# Patient Record
Sex: Male | Born: 1955 | Race: White | Hispanic: No | Marital: Single | Smoking: Never smoker
Health system: Southern US, Community
[De-identification: ages and names within clinical notes are randomized; demographics above are authoritative.]

## PROBLEM LIST (undated history)

## (undated) DIAGNOSIS — G8929 Other chronic pain: Secondary | ICD-10-CM

## (undated) DIAGNOSIS — R42 Dizziness and giddiness: Secondary | ICD-10-CM

## (undated) DIAGNOSIS — F419 Anxiety disorder, unspecified: Secondary | ICD-10-CM

## (undated) DIAGNOSIS — R51 Headache: Secondary | ICD-10-CM

## (undated) DIAGNOSIS — F32A Depression, unspecified: Secondary | ICD-10-CM

## (undated) DIAGNOSIS — F329 Major depressive disorder, single episode, unspecified: Secondary | ICD-10-CM

## (undated) DIAGNOSIS — K219 Gastro-esophageal reflux disease without esophagitis: Secondary | ICD-10-CM

## (undated) DIAGNOSIS — J45909 Unspecified asthma, uncomplicated: Secondary | ICD-10-CM

## (undated) DIAGNOSIS — N200 Calculus of kidney: Secondary | ICD-10-CM

## (undated) DIAGNOSIS — R519 Headache, unspecified: Secondary | ICD-10-CM

## (undated) DIAGNOSIS — L409 Psoriasis, unspecified: Secondary | ICD-10-CM

## (undated) DIAGNOSIS — R109 Unspecified abdominal pain: Secondary | ICD-10-CM

## (undated) DIAGNOSIS — M79673 Pain in unspecified foot: Secondary | ICD-10-CM

## (undated) HISTORY — PX: TONSILLECTOMY: SUR1361

## (undated) HISTORY — PX: COLONOSCOPY: SHX174

## (undated) HISTORY — PX: APPENDECTOMY: SHX54

## (undated) HISTORY — PX: OTHER SURGICAL HISTORY: SHX169

## (undated) HISTORY — PX: WISDOM TOOTH EXTRACTION: SHX21

---

## 1999-09-16 ENCOUNTER — Encounter: Payer: Self-pay | Admitting: Emergency Medicine

## 1999-09-16 ENCOUNTER — Emergency Department (HOSPITAL_COMMUNITY): Admission: EM | Admit: 1999-09-16 | Discharge: 1999-09-16 | Payer: Self-pay | Admitting: Emergency Medicine

## 1999-10-07 ENCOUNTER — Encounter: Payer: Self-pay | Admitting: Gastroenterology

## 1999-10-07 ENCOUNTER — Encounter: Admission: RE | Admit: 1999-10-07 | Discharge: 1999-10-07 | Payer: Self-pay | Admitting: Gastroenterology

## 1999-10-29 ENCOUNTER — Encounter: Payer: Self-pay | Admitting: Gastroenterology

## 1999-10-29 ENCOUNTER — Ambulatory Visit (HOSPITAL_COMMUNITY): Admission: RE | Admit: 1999-10-29 | Discharge: 1999-10-29 | Payer: Self-pay | Admitting: Gastroenterology

## 1999-11-04 ENCOUNTER — Encounter: Admission: RE | Admit: 1999-11-04 | Discharge: 1999-11-04 | Payer: Self-pay | Admitting: Gastroenterology

## 1999-11-04 ENCOUNTER — Encounter: Payer: Self-pay | Admitting: Gastroenterology

## 2004-11-05 ENCOUNTER — Encounter: Admission: RE | Admit: 2004-11-05 | Discharge: 2004-11-05 | Payer: Self-pay | Admitting: Family Medicine

## 2008-07-27 ENCOUNTER — Encounter: Admission: RE | Admit: 2008-07-27 | Discharge: 2008-07-27 | Payer: Self-pay | Admitting: Gastroenterology

## 2008-07-27 ENCOUNTER — Emergency Department: Payer: Self-pay | Admitting: Emergency Medicine

## 2008-11-23 ENCOUNTER — Encounter: Admission: RE | Admit: 2008-11-23 | Discharge: 2008-11-23 | Payer: Self-pay | Admitting: Family Medicine

## 2009-04-02 ENCOUNTER — Inpatient Hospital Stay: Payer: Self-pay | Admitting: Unknown Physician Specialty

## 2009-05-09 ENCOUNTER — Encounter: Admission: RE | Admit: 2009-05-09 | Discharge: 2009-05-09 | Payer: Self-pay | Admitting: Gastroenterology

## 2011-02-03 ENCOUNTER — Other Ambulatory Visit: Payer: Self-pay | Admitting: Otolaryngology

## 2011-02-23 ENCOUNTER — Other Ambulatory Visit: Payer: Self-pay

## 2011-08-31 ENCOUNTER — Ambulatory Visit
Admission: RE | Admit: 2011-08-31 | Discharge: 2011-08-31 | Disposition: A | Discharge status: Home / Self Care | Payer: BC Managed Care – PPO | Source: Ambulatory Visit | Attending: Otolaryngology | Admitting: Otolaryngology

## 2011-08-31 MED ORDER — GADOBENATE DIMEGLUMINE 529 MG/ML IV SOLN
18.0000 mL | Freq: Once | INTRAVENOUS | Status: AC | PRN
  Administered 2011-08-31: 18 mL via INTRAVENOUS

## 2012-03-18 ENCOUNTER — Emergency Department (HOSPITAL_COMMUNITY)
Admission: EM | Admit: 2012-03-18 | Discharge: 2012-03-18 | Disposition: A | Discharge status: Home / Self Care | Payer: BC Managed Care – PPO | Attending: Emergency Medicine | Admitting: Emergency Medicine

## 2012-03-18 ENCOUNTER — Encounter (HOSPITAL_COMMUNITY): Payer: Self-pay | Admitting: Emergency Medicine

## 2012-03-18 DIAGNOSIS — F3289 Other specified depressive episodes: Secondary | ICD-10-CM | POA: Insufficient documentation

## 2012-03-18 DIAGNOSIS — Z79899 Other long term (current) drug therapy: Secondary | ICD-10-CM | POA: Insufficient documentation

## 2012-03-18 DIAGNOSIS — R42 Dizziness and giddiness: Secondary | ICD-10-CM

## 2012-03-18 DIAGNOSIS — F329 Major depressive disorder, single episode, unspecified: Secondary | ICD-10-CM | POA: Insufficient documentation

## 2012-03-18 DIAGNOSIS — H9319 Tinnitus, unspecified ear: Secondary | ICD-10-CM | POA: Insufficient documentation

## 2012-03-18 DIAGNOSIS — R110 Nausea: Secondary | ICD-10-CM | POA: Insufficient documentation

## 2012-03-18 HISTORY — DX: Depression, unspecified: F32.A

## 2012-03-18 HISTORY — DX: Major depressive disorder, single episode, unspecified: F32.9

## 2012-03-18 LAB — BASIC METABOLIC PANEL
Chloride: 105 mEq/L (ref 96–112)
GFR calc Af Amer: 67 mL/min — ABNORMAL LOW (ref >90)
Potassium: 4.3 mEq/L (ref 3.5–5.1)
Sodium: 140 mEq/L (ref 135–145)

## 2012-03-18 LAB — CBC WITH DIFFERENTIAL/PLATELET
Basophils Absolute: 0 10*3/uL (ref 0.0–0.1)
Basophils Relative: 0 % (ref 0–1)
MCHC: 32.6 g/dL (ref 30.0–36.0)
Neutro Abs: 4 10*3/uL (ref 1.7–7.7)
Neutrophils Relative %: 58 % (ref 43–77)
RDW: 12.9 % (ref 11.5–15.5)
WBC: 7 10*3/uL (ref 4.0–10.5)

## 2012-03-18 LAB — GLUCOSE, CAPILLARY

## 2012-03-18 MED ORDER — MECLIZINE HCL 25 MG PO TABS
25.0000 mg | ORAL_TABLET | Freq: Once | ORAL | Status: AC
  Administered 2012-03-18: 25 mg via ORAL
  Filled 2012-03-18: qty 1

## 2012-03-18 MED ORDER — ONDANSETRON 4 MG PO TBDP
4.0000 mg | ORAL_TABLET | Freq: Once | ORAL | Status: AC
  Administered 2012-03-18: 4 mg via ORAL
  Filled 2012-03-18: qty 1

## 2012-03-18 MED ORDER — DIAZEPAM 5 MG PO TABS
5.0000 mg | ORAL_TABLET | Freq: Once | ORAL | Status: AC
  Administered 2012-03-18: 5 mg via ORAL
  Filled 2012-03-18: qty 1

## 2012-03-18 MED ORDER — DIAZEPAM 5 MG PO TABS: 5.0000 mg | ORAL_TABLET | Freq: Four times a day (QID) | ORAL | Status: DC | PRN

## 2012-03-18 MED ORDER — ONDANSETRON 4 MG PO TBDP: ORAL_TABLET | ORAL | Status: DC

## 2012-03-18 MED ORDER — MECLIZINE HCL 25 MG PO TABS: 25.0000 mg | ORAL_TABLET | Freq: Three times a day (TID) | ORAL | Status: DC

## 2012-03-18 NOTE — ED Notes (Signed)
Discharge instructions reviewed. Pt verbalized understanding.  

## 2012-03-18 NOTE — ED Notes (Signed)
Pt. Stated, I've had this dizziness since yesterday especially if i move in bed.  If i go from side to side I am so dizzy, it scares me.  i tried a Draminere yesterday and might have helped some.

## 2012-03-18 NOTE — ED Provider Notes (Signed)
History     CSN: [id number]  Arrival date & time 03/18/12  1334   First MD Initiated Contact with Patient 03/18/12 1458      Chief Complaint  Patient presents with  . Dizziness    (Consider location/radiation/quality/duration/timing/severity/associated sxs/prior treatment) HPI Comments: Went to urgent care yesterday for the same symptoms. He was told he could be hypoglycemic, however his blood sugar was not checked. He went to the pharmacy and bought a blood sugar monitor and stated his sugars were all between 800.  Patient is a 56 y.o. male presenting with neurologic complaint.  Neurologic Problem The primary symptoms include dizziness and nausea. Primary symptoms do not include fever or vomiting. The symptoms began yesterday. The symptoms are waxing and waning. The neurological symptoms are diffuse. Context: When he changes positions.  He describes the dizziness as a sensation of spinning. The dizziness began yesterday. The dizziness has been gradually improving since its onset. It is a new problem. The dizziness is associated with a specific position. Dizziness also occurs with tinnitus (Chronic) and nausea. Dizziness does not occur with blurred vision or vomiting.  Additional symptoms include tinnitus (Chronic). Medical issues do not include seizures, cerebral vascular accident or cancer. Workup history includes MRI (Negative).    Past Medical History  Diagnosis Date  . Depression     History reviewed. No pertinent past surgical history.  No family history on file.  History  Substance Use Topics  . Smoking status: Not on file  . Smokeless tobacco: Not on file  . Alcohol Use:       Review of Systems  Constitutional: Negative for fever and chills.  HENT: Positive for tinnitus (Chronic).   Eyes: Negative for blurred vision.  Respiratory: Negative for cough and shortness of breath.   Cardiovascular: Negative for chest pain and leg swelling.  Gastrointestinal: Positive  for nausea. Negative for vomiting, blood in stool and anal bleeding.  Neurological: Positive for dizziness.  All other systems reviewed and are negative.    Allergies  Ivp dye  Home Medications   Current Outpatient Rx  Name  Route  Sig  Dispense  Refill  . BUPROPION HCL ER (SR) 150 MG PO TB12   Oral   Take 150 mg by mouth daily.         . BUSPIRONE HCL 5 MG PO TABS   Oral   Take 5 mg by mouth daily.         Marland Kitchen OMEPRAZOLE-SODIUM BICARBONATE 40-1100 MG PO CAPS   Oral   Take 1 capsule by mouth daily before breakfast.         . TRAZODONE HCL 50 MG PO TABS   Oral   Take 25 mg by mouth at bedtime.           BP 127/80  Pulse 90  Temp 98.2 F (36.8 C) (Oral)  Resp 20  Ht 5\' 9"  (1.753 m)  Wt 195 lb (88.451 kg)  BMI 28.80 kg/m2  SpO2 96%  Physical Exam  Nursing note and vitals reviewed. Constitutional: He is oriented to person, place, and time. He appears well-developed and well-nourished. No distress.  HENT:  Head: Normocephalic and atraumatic.  Mouth/Throat: No oropharyngeal exudate.  Eyes: EOM are normal. Pupils are equal, round, and reactive to light.  Neck: Normal range of motion. Neck supple.  Cardiovascular: Normal rate and regular rhythm.  Exam reveals no friction rub.   No murmur heard. Pulmonary/Chest: Effort normal and breath sounds normal. No respiratory distress.  He has no wheezes. He has no rales.  Abdominal: He exhibits no distension. There is no tenderness. There is no rebound.  Musculoskeletal: Normal range of motion. He exhibits no edema.  Neurological: He is alert and oriented to person, place, and time. No cranial nerve deficit. He exhibits normal muscle tone. Coordination normal.       Head impulse testing normal. Patient has mild fatigable nystagmus with left to right and right to left eye movements. Dix-Hallpike grossly positive for return symptoms.  Skin: He is not diaphoretic.    ED Course  Procedures (including critical care  time)  Labs Reviewed  GLUCOSE, CAPILLARY - Abnormal; Notable for the following:    Glucose-Capillary 136 (*)     All other components within normal limits  BASIC METABOLIC PANEL - Abnormal; Notable for the following:    Glucose, Bld 117 (*)     GFR calc non Af Amer 57 (*)     GFR calc Af Amer 67 (*)     All other components within normal limits  CBC WITH DIFFERENTIAL   No results found.   1. Vertigo       MDM  56 year old male with no prior medical history other than depression presents with dizziness. Began yesterday morning and has been waxing and waning since. Was told he was hypoglycemic urgent care, however his home blood sugar monitor that he bought stated no hypoglycemia. His dizziness is described as a sensation of spinning it is worse with position changes like rolling over in bed. This is accompanied by some nausea. He has had an extensive workup by ENT and neurology for some momentary lapses of balance for the last several months. This workup included MRI of his brain, sinus evaluation, and a dental eval. Patient does not want any imaging today. On exam, patient has normal gait and other normal neurologic exam findings. Normal strength and sensation, normal coordination, normal cranial nerves. He does have a positive Dix-Hallpike and had some fatigable nystagmus with alternating eye movements. Patient symptoms are consistent with peripheral vertigo. I do not feel he has central vertigo. Patient given meclizine, Valium, Zofran here. He will go home with prescription for the same and instructions to followup with primary care.         Elwin Mocha, MD 03/18/12 1600

## 2012-03-19 NOTE — ED Provider Notes (Signed)
  I performed a history and physical examination of Scott Dominguez and discussed his management with Dr. Gwendolyn Grant.  I agree with the history, physical, assessment, and plan of care, with the following exceptions: None  On my exam he was symptomatic, having just had a Dix-Hallpike manouever.    Elyse Jarvis, MD 03/19/12 0005

## 2013-02-27 ENCOUNTER — Ambulatory Visit
Admission: RE | Admit: 2013-02-27 | Discharge: 2013-02-27 | Disposition: A | Discharge status: Home / Self Care | Payer: BC Managed Care – PPO | Source: Ambulatory Visit | Attending: Family Medicine | Admitting: Family Medicine

## 2013-02-27 ENCOUNTER — Other Ambulatory Visit: Payer: Self-pay | Admitting: Family Medicine

## 2013-02-27 DIAGNOSIS — M25571 Pain in right ankle and joints of right foot: Secondary | ICD-10-CM

## 2013-03-03 ENCOUNTER — Ambulatory Visit (INDEPENDENT_AMBULATORY_CARE_PROVIDER_SITE_OTHER): Payer: BC Managed Care – PPO

## 2013-03-03 VITALS — BP 137/85 | HR 83 | Resp 16 | Ht 71.0 in | Wt 195.0 lb

## 2013-03-03 DIAGNOSIS — R52 Pain, unspecified: Secondary | ICD-10-CM

## 2013-03-03 DIAGNOSIS — M7750 Other enthesopathy of unspecified foot: Secondary | ICD-10-CM

## 2013-03-03 DIAGNOSIS — R609 Edema, unspecified: Secondary | ICD-10-CM

## 2013-03-03 DIAGNOSIS — S93409A Sprain of unspecified ligament of unspecified ankle, initial encounter: Secondary | ICD-10-CM

## 2013-03-03 DIAGNOSIS — M7751 Other enthesopathy of right foot: Secondary | ICD-10-CM

## 2013-03-03 DIAGNOSIS — S93401A Sprain of unspecified ligament of right ankle, initial encounter: Secondary | ICD-10-CM

## 2013-03-03 NOTE — Progress Notes (Signed)
  Subjective:    Patient ID: Scott Dominguez, male    DOB: Dec 01, 1955, 57 y.o.   MRN: [id number] "I twisted this thing last Saturday.  It's still swollen and it's hurts."  Foot Injury  The incident occurred 5 to 7 days ago. The incident occurred in the yard. The injury mechanism was a twisting injury. The pain is present in the right ankle. The quality of the pain is described as aching. The pain is at a severity of 6/10. The pain is moderate. The pain has been intermittent since onset. Associated symptoms include an inability to bear weight and a loss of motion. He reports no foreign bodies present. The symptoms are aggravated by weight bearing and movement. He has tried elevation (ankle stabilizer, xrays, saw Dr. Irena Reichmann) for the symptoms. The treatment provided mild relief.      Review of Systems  HENT: Positive for sinus pressure and tinnitus.   Musculoskeletal: Positive for gait problem and joint swelling.  Psychiatric/Behavioral: The patient is nervous/anxious.   All other systems reviewed and are negative.       Objective:   Physical Exam Neurovascular status appears to be intact bilateral epicritic and proprioceptive sensations intact and symmetric bilateral. No severe ecchymosis no significant edema noted when compared to right to left foot. X-rays from previous visit of right ankle are reviewed. No signs of fracture or osseous abnormality were noted. However exam today although there was some pain in the retromalleolar area of lateral right ankle more significant pain was present on the lateral Lisfranc's fourth fifth metatarsal base and cuboid articulation and along the base of the fifth metatarsal. Also significant tenderness along the sinus tarsi area. At this time new x-rays of the foot were taken AP lateral oblique views. These also failed to demonstrate any fracture or significant dislocation cyst or tumor again based on these findings I do feel this is strictly a soft tissue  injury consistent with ankle sprain and midfoot/rear foot sprain of Lisfranc's joint and the lateral ankle joint sprain. Patient has been trying to ambulate with a OTC ankle splint with little success or improvement     Assessment & Plan:  Assessment this time no significant fracture identified cannot be ruled out with complete certainty although 95% likely no fracture. Most likely ankle sprain and possible sinus tarsi capsulitis a Lisfranc's capsulitis and sprain of the mid tarsus and Lisfranc joint. With lateral column injury. Plan at this time patient placed in the air fracture walker help with both the soft tissue swelling and edema and foot and ankle stability. Maintain for at least a 4 week duration as instructed may he is crocs or walk walking cane if needed followup if no significant improvement within 4 weeks. Maintain NSAID therapy or Advil and ice and as-needed basis  Alvan Dame DPM

## 2013-03-03 NOTE — Patient Instructions (Signed)

## 2014-05-24 ENCOUNTER — Other Ambulatory Visit: Payer: Self-pay | Admitting: Gastroenterology

## 2014-05-24 DIAGNOSIS — R1084 Generalized abdominal pain: Secondary | ICD-10-CM

## 2014-06-05 ENCOUNTER — Ambulatory Visit (INDEPENDENT_AMBULATORY_CARE_PROVIDER_SITE_OTHER): Payer: BLUE CROSS/BLUE SHIELD

## 2014-06-05 DIAGNOSIS — Q66229 Congenital metatarsus adductus, unspecified foot: Secondary | ICD-10-CM

## 2014-06-05 DIAGNOSIS — G5762 Lesion of plantar nerve, left lower limb: Secondary | ICD-10-CM

## 2014-06-05 DIAGNOSIS — R52 Pain, unspecified: Secondary | ICD-10-CM

## 2014-06-05 DIAGNOSIS — Q662 Congenital metatarsus (primus) varus: Secondary | ICD-10-CM

## 2014-06-05 DIAGNOSIS — M79673 Pain in unspecified foot: Secondary | ICD-10-CM

## 2014-06-05 DIAGNOSIS — M722 Plantar fascial fibromatosis: Secondary | ICD-10-CM

## 2014-06-05 DIAGNOSIS — R609 Edema, unspecified: Secondary | ICD-10-CM

## 2014-06-05 DIAGNOSIS — G5763 Lesion of plantar nerve, bilateral lower limbs: Secondary | ICD-10-CM

## 2014-06-05 MED ORDER — MELOXICAM 15 MG PO TABS: 15.0000 mg | ORAL_TABLET | Freq: Every day | ORAL | Status: DC

## 2014-06-05 NOTE — Progress Notes (Signed)
   Subjective:    Patient ID: Scott Dominguez, male    DOB: 1956-02-21, 59 y.o.   MRN: [id number]  HPI PT STATED B/L BALL OF THE FOOT AND ARCH IS ACHING AND SWOLLEN FOR 2 YEARS. THE FOOT IS GETTING WORSE AND GET AGGRAVATED BY SITTING DOWN . TRIED HOT BATH AND IT HELP SOME.   Review of Systems  All other systems reviewed and are negative.      Objective:   Physical Exam 59 year old white male well-developed well-nourished oriented 3 presents this time with a new problem she's had problems in the past valgus really exacerbated feels like there is tightness and swelling in the ball and arch of his foot when he pushes on the arch is pain up into the toes together second interspace of both feet left more slightly worse than right. No history of injury or trauma however currently is wearing a pair of sketcher shoes with memory foam they're extremely flimsy and flexible and mimic barefoot walking or running they have no structure supporter stability. Lower extremity objective findings reveal vascular status to be intact pedal pulses palpable DP +2 over 4 PT +2 over 4 bilateral capillary refill time 3 seconds all digits epicritic and proprioceptive sensations intact and symmetric there is normal plantar response DTRs not listed neurologically skin color pigment normal hair growth absent nails somewhat criptotic orthopedic exam rectus foot type slight met adductus forefoot mild bunion deformity and some semirigid digital contractures noted. No open wounds no ulcers no secondary infections no severe keratoses noted there is pain exquisite tenderness to mid arch mid and plantar fascia from mid band to the MTP joint area of both feet left more so than right. There is also pain on direct lateral compression second interspace bilateral left more so than right. 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 Plan at this time patient is given an injection tendons Kenalog, Xylocaine plain to the second interspace bilateral fascial strapping is applied to both feet recommended meloxicam 15 g once daily to the discontinue if any GI upset occurs recheck in 2 weeks for follow-up also recommended ice to the heel every evening and stressed better shoes Brooks new balance [place]'s or even crocs for around the house no barefoot or flimsy shoes or flip-flops are Claudette Stapler DPM

## 2014-06-05 NOTE — Patient Instructions (Signed)
Plantar Fasciitis Plantar fasciitis is a common condition that causes foot pain. It is soreness (inflammation) of the band of tough fibrous tissue on the bottom of the foot that runs from the heel bone (calcaneus) to the ball of the foot. The cause of this soreness may be from excessive standing, poor fitting shoes, running on hard surfaces, being overweight, having an abnormal walk, or overuse (this is common in runners) of the painful foot or feet. It is also common in aerobic exercise dancers and ballet dancers. SYMPTOMS  Most people with plantar fasciitis complain of:  Severe pain in the morning on the bottom of their foot especially when taking the first steps out of bed. This pain recedes after a few minutes of walking.  Severe pain is experienced also during walking following a long period of inactivity.  Pain is worse when walking barefoot or up stairs DIAGNOSIS   Your caregiver will diagnose this condition by examining and feeling your foot.  Special tests such as X-rays of your foot, are usually not needed. PREVENTION   Consult a sports medicine professional before beginning a new exercise program.  Walking programs offer a good workout. With walking there is a lower chance of overuse injuries common to runners. There is less impact and less jarring of the joints.  Begin all new exercise programs slowly. If problems or pain develop, decrease the amount of time or distance until you are at a comfortable level.  Wear good shoes and replace them regularly.  Stretch your foot and the heel cords at the back of the ankle (Achilles tendon) both before and after exercise.  Run or exercise on even surfaces that are not hard. For example, asphalt is better than pavement.  Do not run barefoot on hard surfaces.  If using a treadmill, vary the incline.  Do not continue to workout if you have foot or joint problems. Seek professional help if they do not improve. HOME CARE INSTRUCTIONS     Avoid activities that cause you pain until you recover.  Use ice or cold packs on the problem or painful areas after working out.  Only take over-the-counter or prescription medicines for pain, discomfort, or fever as directed by your caregiver.  Soft shoe inserts or athletic shoes with air or gel sole cushions may be helpful.  If problems continue or become more severe, consult a sports medicine caregiver or your own health care provider. Cortisone is a potent anti-inflammatory medication that may be injected into the painful area. You can discuss this treatment with your caregiver. MAKE SURE YOU:   Understand these instructions.  Will watch your condition.  Will get help right away if you are not doing well or get worse. Document Released: 12/30/2000 Document Revised: 06/29/2011 Document Reviewed: 02/29/2008 [employer] Patient Information 2015 [employer]. This information is not intended to replace advice given to you by your health care provider. Make sure you discuss any questions you have with your health care provider.    ICE INSTRUCTIONS  Apply ice or cold pack to the affected area at least 3 times a day for 10-15 minutes each time.  You should also use ice after prolonged activity or vigorous exercise.  Do not apply ice longer than 20 minutes at one time.  Always keep a cloth between your skin and the ice pack to prevent burns.  Being consistent and following these instructions will help control your symptoms.  We suggest you purchase a gel ice pack because they are   reusable and do bit leak.  Some of them are designed to wrap around the area.  Use the method that works best for you.  Here are some other suggestions for icing.   Use a frozen bag of peas or corn-inexpensive and molds well to your body, usually stays frozen for 10 to 20 minutes.  Wet a towel with cold water and squeeze out the excess until it's damp.  Place in a bag in the freezer for 20 minutes. Then remove  and use. 

## 2014-06-06 ENCOUNTER — Other Ambulatory Visit: Payer: Self-pay

## 2014-06-09 ENCOUNTER — Emergency Department (HOSPITAL_COMMUNITY): Payer: BLUE CROSS/BLUE SHIELD

## 2014-06-09 ENCOUNTER — Emergency Department (HOSPITAL_COMMUNITY)
Admission: EM | Admit: 2014-06-09 | Discharge: 2014-06-09 | Disposition: A | Discharge status: Home / Self Care | Payer: BLUE CROSS/BLUE SHIELD | Attending: Emergency Medicine | Admitting: Emergency Medicine

## 2014-06-09 ENCOUNTER — Encounter (HOSPITAL_COMMUNITY): Payer: Self-pay | Admitting: Emergency Medicine

## 2014-06-09 DIAGNOSIS — R1084 Generalized abdominal pain: Secondary | ICD-10-CM | POA: Diagnosis present

## 2014-06-09 DIAGNOSIS — Z79899 Other long term (current) drug therapy: Secondary | ICD-10-CM | POA: Insufficient documentation

## 2014-06-09 DIAGNOSIS — K219 Gastro-esophageal reflux disease without esophagitis: Secondary | ICD-10-CM | POA: Insufficient documentation

## 2014-06-09 DIAGNOSIS — Z791 Long term (current) use of non-steroidal anti-inflammatories (NSAID): Secondary | ICD-10-CM | POA: Insufficient documentation

## 2014-06-09 DIAGNOSIS — G8929 Other chronic pain: Secondary | ICD-10-CM | POA: Insufficient documentation

## 2014-06-09 DIAGNOSIS — R109 Unspecified abdominal pain: Secondary | ICD-10-CM

## 2014-06-09 DIAGNOSIS — Z8659 Personal history of other mental and behavioral disorders: Secondary | ICD-10-CM | POA: Diagnosis not present

## 2014-06-09 HISTORY — DX: Pain in unspecified foot: M79.673

## 2014-06-09 HISTORY — DX: Headache: R51

## 2014-06-09 HISTORY — DX: Gastro-esophageal reflux disease without esophagitis: K21.9

## 2014-06-09 HISTORY — DX: Unspecified abdominal pain: R10.9

## 2014-06-09 HISTORY — DX: Other chronic pain: G89.29

## 2014-06-09 HISTORY — DX: Headache, unspecified: R51.9

## 2014-06-09 HISTORY — DX: Dizziness and giddiness: R42

## 2014-06-09 LAB — CBC WITH DIFFERENTIAL/PLATELET
Basophils Absolute: 0 10*3/uL (ref 0.0–0.1)
Basophils Relative: 0 % (ref 0–1)
EOS ABS: 0.2 10*3/uL (ref 0.0–0.7)
Eosinophils Relative: 2 % (ref 0–5)
HEMATOCRIT: 40 % (ref 39.0–52.0)
Hemoglobin: 14 g/dL (ref 13.0–17.0)
LYMPHS PCT: 29 % (ref 12–46)
Lymphs Abs: 3 10*3/uL (ref 0.7–4.0)
MCH: 31.9 pg (ref 26.0–34.0)
MCHC: 35 g/dL (ref 30.0–36.0)
MCV: 91.1 fL (ref 78.0–100.0)
MONO ABS: 0.8 10*3/uL (ref 0.1–1.0)
Monocytes Relative: 8 % (ref 3–12)
NEUTROS ABS: 6.4 10*3/uL (ref 1.7–7.7)
Neutrophils Relative %: 61 % (ref 43–77)
Platelets: 295 10*3/uL (ref 150–400)
RBC: 4.39 MIL/uL (ref 4.22–5.81)
RDW: 12.5 % (ref 11.5–15.5)
WBC: 10.3 10*3/uL (ref 4.0–10.5)

## 2014-06-09 LAB — COMPREHENSIVE METABOLIC PANEL
ALK PHOS: 56 U/L (ref 39–117)
ALT: 28 U/L (ref 0–53)
AST: 20 U/L (ref 0–37)
Albumin: 4.2 g/dL (ref 3.5–5.2)
Anion gap: 7 (ref 5–15)
BILIRUBIN TOTAL: 0.9 mg/dL (ref 0.3–1.2)
BUN: 12 mg/dL (ref 6–23)
CALCIUM: 9.4 mg/dL (ref 8.4–10.5)
CO2: 27 mmol/L (ref 19–32)
CREATININE: 1.22 mg/dL (ref 0.50–1.35)
Chloride: 103 mmol/L (ref 96–112)
GFR, EST AFRICAN AMERICAN: 73 mL/min — AB (ref >90)
GFR, EST NON AFRICAN AMERICAN: 63 mL/min — AB (ref >90)
Glucose, Bld: 109 mg/dL — ABNORMAL HIGH (ref 70–99)
Potassium: 3.7 mmol/L (ref 3.5–5.1)
Sodium: 137 mmol/L (ref 135–145)
Total Protein: 7.2 g/dL (ref 6.0–8.3)

## 2014-06-09 LAB — LIPASE, BLOOD: LIPASE: 30 U/L (ref 11–59)

## 2014-06-09 MED ORDER — DICYCLOMINE HCL 20 MG PO TABS: 20.0000 mg | ORAL_TABLET | Freq: Four times a day (QID) | ORAL | Status: DC | PRN

## 2014-06-09 MED ORDER — DICYCLOMINE HCL 10 MG/ML IM SOLN
20.0000 mg | Freq: Once | INTRAMUSCULAR | Status: AC
  Administered 2014-06-09: 20 mg via INTRAMUSCULAR
  Filled 2014-06-09: qty 2

## 2014-06-09 NOTE — ED Provider Notes (Signed)
CSN: [id number]     Arrival date & time 06/09/14  5462 History   First MD Initiated Contact with Patient 06/09/14 [phone]     Chief Complaint  Patient presents with  . Abdominal Cramping  . Foot Pain      HPI  Pt was seen at 0945.  Per pt, c/o gradual onset and persistence of constant generalized abd "pain" for the past 2 to 3 weeks. Describes the abd pain as "cramping."  States he has been evaluated by his GI Dr. Cristina Gong last week for this complaint, and had an abd ultrasound ordered. Pt states "they couldn't do it because I had ate" so it was rescheduled for next week. Pt states he "can't wait until then" because "the cramps keep me up at night." Denies N/V, no diarrhea, no fevers, no back pain, no rash, no CP/SOB, no black or blood in stools or emesis.      Past Medical History  Diagnosis Date  . Depression   . Vertigo   . Chronic foot pain     bilateral  . Headache   . Chronic abdominal pain   . GERD (gastroesophageal reflux disease)    Past Surgical History  Procedure Laterality Date  . Appendectomy    . Wisdom tooth extraction     Family History  Problem Relation Age of Onset  . Heart disease Mother    History  Substance Use Topics  . Smoking status: Never Smoker   . Smokeless tobacco: Not on file  . Alcohol Use: No    Review of Systems ROS: Statement: All systems negative except as marked or noted in the HPI; Constitutional: Negative for fever and chills. ; ; Eyes: Negative for eye pain, redness and discharge. ; ; ENMT: Negative for ear pain, hoarseness, nasal congestion, sinus pressure and sore throat. ; ; Cardiovascular: Negative for chest pain, palpitations, diaphoresis, dyspnea and peripheral edema. ; ; Respiratory: Negative for cough, wheezing and stridor. ; ; Gastrointestinal: +abd pain. Negative for nausea, vomiting, diarrhea, blood in stool, hematemesis, jaundice and rectal bleeding. . ; ; Genitourinary: Negative for dysuria, flank pain and hematuria. ; ;  Musculoskeletal: Negative for back pain and neck pain. Negative for swelling and trauma.; ; Skin: Negative for pruritus, rash, abrasions, blisters, bruising and skin lesion.; ; Neuro: Negative for headache, lightheadedness and neck stiffness. Negative for weakness, altered level of consciousness , altered mental status, extremity weakness, paresthesias, involuntary movement, seizure and syncope.      Allergies  Ivp dye  Home Medications   Prior to Admission medications   Medication Sig Start Date End Date Taking? Authorizing Provider  dexlansoprazole (DEXILANT) 60 MG capsule Take 60 mg by mouth daily.   Yes Historical Provider, MD  ENBREL SURECLICK 50 MG/ML injection Inject 50 mg as directed every 7 (seven) days. 05/09/14  Yes Historical Provider, MD  meloxicam (MOBIC) 15 MG tablet Take 1 tablet (15 mg total) by mouth daily. 06/05/14   Richard Sikora, DPM   BP 143/93 mmHg  Temp(Src) 98.3 F (36.8 C) (Oral)  Resp 16  SpO2 99% Physical Exam  0950; Physical examination:  Nursing notes reviewed; Vital signs and O2 SAT reviewed;  Constitutional: Well developed, Well nourished, Well hydrated, In no acute distress; Head:  Normocephalic, atraumatic; Eyes: EOMI, PERRL, No scleral icterus; ENMT: Mouth and pharynx normal, Mucous membranes moist; Neck: Supple, Full range of motion, No lymphadenopathy; Cardiovascular: Regular rate and rhythm, No gallop; Respiratory: Breath sounds clear & equal bilaterally, No rales, rhonchi, wheezes.  Speaking full sentences with ease, Normal respiratory effort/excursion; Chest: Nontender, Movement normal; Abdomen: Soft, +diffuse tenderness to palp, esp mid-epigastric, RUQ and LUQ areas. Nondistended, Normal bowel sounds; Genitourinary: No CVA tenderness; Extremities: Pulses normal, No tenderness, No edema, No calf edema or asymmetry.; Neuro: AA&Ox3, Major CN grossly intact.  Speech clear. No gross focal motor or sensory deficits in extremities.; Skin: Color normal, Warm,  Dry.   ED Course  Procedures    [phone]:  Pt also told the Triage RN that "my feet hurt." Pt has hx of chronic feet "pain" and was seen by his DPM 4 days ago. Denies any change in his usual chronic pain. Pt referred back to his DPM for this chronic complaint.  Pt also states he "came to the ED for an ultrasound" of his abd. Pt refuses CT scan, stating "I'll do that as a last resort." Informed pt that the [place] will be a limited evaluation of his upper abd organs, and not include his bowel, which may (or may not) be the cause for his discomfort. Pt verb understanding and continues to refuse CT scan; understanding the consequences of his decision. Pt makes his own medical decisions. Will order labs, [place] and AXR.   1255:  Pt has tol PO well while in the ED without N/V.  No stooling while in the ED. VSS. Feels better after meds and wants to go home now. Dx and testing d/w pt.  Questions answered.  Verb understanding, agreeable to d/c home with outpt f/u with is GI MD.       EKG Interpretation None      MDM  MDM Reviewed: previous chart, nursing note and vitals Reviewed previous: CT scan, ultrasound and labs Interpretation: labs and ultrasound      Results for orders placed or performed during the hospital encounter of 06/09/14  Comprehensive metabolic panel  Result Value Ref Range   Sodium 137 135 - 145 mmol/L   Potassium 3.7 3.5 - 5.1 mmol/L   Chloride 103 96 - 112 mmol/L   CO2 27 19 - 32 mmol/L   Glucose, Bld 109 (H) 70 - 99 mg/dL   BUN 12 6 - 23 mg/dL   Creatinine, Ser 1.22 0.50 - 1.35 mg/dL   Calcium 9.4 8.4 - 10.5 mg/dL   Total Protein 7.2 6.0 - 8.3 g/dL   Albumin 4.2 3.5 - 5.2 g/dL   AST 20 0 - 37 U/L   ALT 28 0 - 53 U/L   Alkaline Phosphatase 56 39 - 117 U/L   Total Bilirubin 0.9 0.3 - 1.2 mg/dL   GFR calc non Af Amer 63 (L) >90 mL/min   GFR calc Af Amer 73 (L) >90 mL/min   Anion gap 7 5 - 15  Lipase, blood  Result Value Ref Range   Lipase 30 11 - 59 U/L  CBC with  Differential  Result Value Ref Range   WBC 10.3 4.0 - 10.5 K/uL   RBC 4.39 4.22 - 5.81 MIL/uL   Hemoglobin 14.0 13.0 - 17.0 g/dL   HCT 40.0 39.0 - 52.0 %   MCV 91.1 78.0 - 100.0 fL   MCH 31.9 26.0 - 34.0 pg   MCHC 35.0 30.0 - 36.0 g/dL   RDW 12.5 11.5 - 15.5 %   Platelets 295 150 - 400 K/uL   Neutrophils Relative % 61 43 - 77 %   Neutro Abs 6.4 1.7 - 7.7 K/uL   Lymphocytes Relative 29 12 - 46 %   Lymphs  Abs 3.0 0.7 - 4.0 K/uL   Monocytes Relative 8 3 - 12 %   Monocytes Absolute 0.8 0.1 - 1.0 K/uL   Eosinophils Relative 2 0 - 5 %   Eosinophils Absolute 0.2 0.0 - 0.7 K/uL   Basophils Relative 0 0 - 1 %   Basophils Absolute 0.0 0.0 - 0.1 K/uL   US Abdomen Complete 06/09/2014   CLINICAL DATA:  59 year old male with a history of abdominal pain  EXAM: ULTRASOUND ABDOMEN COMPLETE  COMPARISON:  Plain film 07/04/2013, CT 08/30/2009  FINDINGS: Gallbladder: Heterogeneously echoic material within the gallbladder lumen compatible with biliary sludge/microlithiasis. No gallbladder wall thickening which measures 2.1 mm. No pericholecystic fluid. Negative sonographic Murphy's sign.  Common bile duct: Diameter: 4.4 mm  Liver: No focal lesion identified. Within normal limits in parenchymal echogenicity.  IVC: No abnormality visualized.  Pancreas: Visualized portion unremarkable.  Spleen: Size and appearance within normal limits.  Right Kidney: Length: 14.5 cm. Echogenicity of the right kidney similar to that of the adjacent liver. Echogenic focus at the inferior collecting system measuring 1.4 cm with color comet tail artifact. No hydronephrosis.  Left Kidney: Length: 12.9 cm. Echogenicity of the left kidney similar to that of the adjacent spleen. No hydronephrosis. Hypoechoic lesion with posterior acoustic enhancement measuring 2.5 cm x 2.9 cm x 3.0 cm at the superior pole. No internal flow.  Abdominal aorta: No aneurysm visualized.  Other findings: None.  IMPRESSION: No acute finding of the upper abdomen,  specifically without evidence of acute cholecystitis.  Biliary cholelithiasis/sludge.  Nonobstructive kidney stone of the inferior pole of the right kidney with no hydronephrosis. Stone measures approximately 1.4 cm. This was likely present on the prior plain film.  Bosniak 1 cyst of the left kidney, upper pole.  Signed,  Dulcy Fanny. Earleen Newport, DO  [facility]  [facility]   Electronically Signed   By: Corrie Mckusick D.O.   On: 06/09/2014 11:36   Dg Abd Acute W/chest 06/09/2014   CLINICAL DATA:  Abdominal pain for 2 weeks.  EXAM: ACUTE ABDOMEN SERIES (ABDOMEN 2 VIEW & CHEST 1 VIEW)  COMPARISON:  07/04/2013  FINDINGS: There is no evidence of dilated bowel loops or free intraperitoneal air. Right inferior pole renal calculus is noted measuring 1 cm. Tiny stone is noted within the inferior pole of the left kidney. Heart size and mediastinal contours are within normal limits. Both lungs are clear.  IMPRESSION: 1. Normal bowel gas pattern. 2. Bilateral renal calculi.   Electronically Signed   By: Kerby Moors M.D.   On: 06/09/2014 11:48      Francine Graven, DO 06/12/14 [phone]

## 2014-06-09 NOTE — ED Notes (Signed)
Patient transported to Ultrasound 

## 2014-06-09 NOTE — Discharge Instructions (Signed)
°Emergency Department Resource Guide °1) Find a Doctor and Pay Out of Pocket °Although you won't have to find out who is covered by your insurance plan, it is a good idea to ask around and get recommendations. You will then need to call the office and see if the doctor you have chosen will accept you as a new patient and what types of options they offer for patients who are self-pay. Some doctors offer discounts or will set up payment plans for their patients who do not have insurance, but you will need to ask so you aren't surprised when you get to your appointment. ° °2) Contact Your Local Health Department °Not all health departments have doctors that can see patients for sick visits, but many do, so it is worth a call to see if yours does. If you don't know where your local health department is, you can check in your phone book. The CDC also has a tool to help you locate your state's health department, and many state websites also have listings of all of their local health departments. ° °3) Find a Walk-in Clinic °If your illness is not likely to be very severe or complicated, you may want to try a walk in clinic. These are popping up all over the country in pharmacies, drugstores, and shopping centers. They're usually staffed by nurse practitioners or physician assistants that have been trained to treat common illnesses and complaints. They're usually fairly quick and inexpensive. However, if you have serious medical issues or chronic medical problems, these are probably not your best option. ° °No Primary Care Doctor: °- Call [facility] Connect at  832-8000 - they can help you locate a primary care doctor that  accepts your insurance, provides certain services, etc. °- Physician Referral Service- 1-800-533-3463 ° °Chronic Pain Problems: °Organization         Address  Phone   Notes  °[facility]  ([phone]) [phone] Patients need to be referred by their primary care doctor.  ° °Medication  Assistance: °Organization         Address  Phone   Notes  °[place] [facility] [address] °[place], [place] [address] (336) 641-8030 --Must be a resident of [place] °-- Must have NO insurance coverage whatsoever (no Medicaid/ Medicare, etc.) °-- The pt. MUST have a primary care doctor that directs their care regularly and follows them in the community °  °[facility]  (866) 331-1348   °[employer]  (888) 892-1162   ° °Agencies that provide inexpensive medical care: °Organization         Address  Phone   Notes  °[facility]  (336) 832-8035   °[facility]    (336) 832-7272   °[facility] [address] °[place], [place] [address] (336) 832-4777   °[facility] [address], °[place] (336) 271-4999   °[facility]    (336) 373-0678   °[facility]    (336) 272-1050   °[facility] ° [address], [place] Phone:  (336) 832-4444, Fax:  (336) 832-4440 Hours of Operation:  9 am - 6 pm, M-F.  Also accepts Medicaid/Medicare and self-pay.  °[facility] ° [address],  Phone: (336) [phone], Fax: (336) [phone]. Hours of Operation:  8:30 am - 5:30 pm, M-F.  Also accepts Medicaid and self-pay.  °[facility] [place] [address], [place] Phone: (336) 878-6027   °[facility] [address], [place], [place] (336)723-1848, Ext. 123 Mondays & Thursdays: 7-9 AM.  First 15 patients are seen on a first come, first serve basis. °  ° °Medicaid-accepting [place] Providers: ° °Organization         Address  Phone   Notes  °[facility] [address], [place] (336) 641-2100 Also accepts self-pay patients.  °[facility] [address], [place] ° (336) 856-9996   °[facility] [address], [place]  (336) 288-8857   °[facility] [address] [place] [address], [place] (336) 299-7000   °Veita Bland [address], [place]  ° (336) 373-1557 Only accepts [place] Access Medicaid patients after they have their name applied to their card.  ° °Self-Pay (no insurance) in [place]: ° °Organization         Address  Phone   Notes  °Sickle Cell Patients, [facility] [address], [place] (336) 832-1970   °[facility] [address], [place] (336) 832-4400   °[facility] ° [address], [place] (336) 992-4800   °[facility]/Dr. Osei-Bonsu ° [address] [place] [address], [place] or [address], [place] (336) [phone] Phone number for both [place] and [place] locations is the same.  °[facility] [address], [place] (336) 299-0000   °[facility] [address] [place] [address], [place] or [address] (336) 852-7530 °(336) 878-2260   °[facility] [address], [place] (336) 350-1642, phone; (336) 294-5005, fax Sees patients 1st and 3rd Saturday of every month.  Must not qualify for public or private insurance (i.e. Medicaid, Medicare, [place] Health Choice, Veterans' Benefits) • Household income should be no more than 200% of the poverty level •The clinic cannot treat you if you are pregnant or think you are pregnant • Sexually transmitted diseases are not treated at the clinic.  ° ° °Dental Care: °Organization         Address  Phone  Notes  °[place] [facility] [address],  (336) 641-6152 Accepts children up to age 21 who are enrolled in Medicaid or [place] Health Choice; pregnant women with a Medicaid card; and children who have applied for Medicaid or [place] Health Choice, but were declined, whose parents can pay a reduced fee at time of service.  °[place]  [facility] [place]  [address], [place] (336) 641-7733 Accepts children up to age 21 who are enrolled in Medicaid or  Health Choice; pregnant women with a Medicaid card; and children who have applied for Medicaid or  Health Choice, but were declined, whose parents can pay a reduced fee at time of service.  °[facility] ° [address],  (336) 641-4533 Patients are seen by appointment only. Walk-ins are not accepted. [facility] will see patients 18 years of age and older. °Monday - Tuesday (8am-5pm) °Most Wednesdays (8:30-5pm) °$30 per visit, cash only  °[facility] ° [address], [place] (336) 641-4533 Patients are seen by appointment only. Walk-ins are not accepted. [facility] will see patients 18 years of age and older. °One   Wednesday Evening (Monthly: Volunteer Based).  $30 per visit, cash only  °[facility]  (919) [phone] for adults; Children under age 4, call Graduate Pediatric Dentistry at ([phone]) [phone]. Children aged 4-14, please call ([phone]) [phone] to request a pediatric application. ° Dental services are provided in all areas of dental care including fillings, crowns and bridges, complete and partial dentures, implants, gum treatment, root canals, and extractions. Preventive care is also provided. Treatment is provided to both adults and children. °Patients are selected via a lottery and there is often a waiting list. °  °[facility] [address], °[place] ° (336) 763-8833 [email or web address] °  °[facility] [address], [place], [place] (336)723-1848, Ext. 123 Second and Fourth Thursday of each month, opens at 6:30 AM; Clinic ends at 9 AM.  Patients are seen on a first-come first-served basis, and a limited number are seen during each clinic.  ° °[facility] ° [address], [place], [place] (336) [phone]    Eligibility Requirements °You must have lived in [place], or [place] counties for at least the last three months. °  You cannot be eligible for state or federal sponsored healthcare insurance, including Veterans Administration, Medicaid, or Medicare. °  You generally cannot be eligible for healthcare insurance through your employer.  °  How to apply: °Eligibility screenings are held every Tuesday and Wednesday afternoon from 1:00 pm until 4:00 pm. You do not need an appointment for the interview!  °[facility] [address], [place], [place] 336-631-2330   °[facility]  336-342-8273   °[place] [facility]  336-703-3100   °[facility]  336-570-6415   ° °Behavioral Health Resources in the Community: °Intensive Outpatient Programs °Organization         Address  Phone  Notes  °[facility] [address], [place], [place] 336-878-6098   °[facility] [address], [place], [place] 336-832-9800   °[facility] [address], [place], [place] ° 336-882-2125   °[facility] [address],  °[place], [place] 1-800-853-5163 or 336-641-4981   °Substance Abuse Resources °Organization         Address  Phone  Notes  °[facility]  336-882-2125   °[facility]  336-784-9470   °[facility]  336-285-9073   °[facility]  336-845-3988   °Residential & Outpatient Substance Abuse Program  1-800-659-3381   °Psychological Services °Organization         Address  Phone  Notes  °[facility]  336- 832-9600   °[facility]  336- 378-7881   °[facility] [address], [place] 1-800-853-5163 or 336-641-4981   ° °Mobile Crisis Teams °Organization         Address  Phone  Notes  °[facility], Mobile Crisis Care Unit  1-877-626-1772   °Assertive °[facility] ° [address]  [place], [place] 336-834-9664   °Sharon DeEsch [address] °[place] 336-554-5454   ° °Self-Help/Support Groups °Organization         Address  Phone             Notes  °[facility]  - variety of support groups  336- 373-1402 Call for more information  °Narcotics Anonymous (NA), [facility] [address], °[place]  2 meetings at this location  ° °  Residential Treatment Programs Organization         Address  Phone  Notes  [facility] [address],    [place]  1-(458)774-1480   [facility]  [address] [place] [address], [place], [place]   [place], [place] 435-328-6325 Admissions: 8am-3pm M-F  [facility] [address],    [place], [place] 291-916-6060   [facility] [address] [place], [place], [place]   [facility] [address]  [place], [place]   [facility] Intensive Outpatient [address], [place], [place]   [facility] [address]  [place], [place] 1-(385)659-3260 or 858-751-0152   [facility] [address], [place], [place] Accepts Medicaid  [facility] [address]  [place] 1-559-714-9033 Substance Abuse/Addiction Treatment   [facility]         Address  Phone  Notes  [facility]  (905)676-4521   Domenic Schwab, PhD [address] [place], [place]   3191340168 or 671-019-6649   [place], [place] [phone]   [facility] [address] [place], [place] 414-442-2398 Insurance/Medicaid/sponsorship through [facility] [address]                                    [place], [place] 820-861-2374 [place] [address], [place] ([phone]    Dr. Adele Schilder  909-704-1033   [facility] Dept. 1) [address], [place] 2) [place] 3)  [place] [address], [place] [phone] [phone]  [phone]   [place] 931-161-6376 or 947-472-9612 (After Hours)      Eat a bland diet, avoiding greasy, fatty, fried foods, as well as spicy and acidic foods or beverages.  Avoid eating within the hour or 2 before going to bed or laying down.  Also avoid teas, colas, coffee, chocolate, pepermint and spearment.  Take over the counter pepcid, one tablet by mouth twice a day, for the next 2 to 3 weeks.  May also take over the counter maalox/mylanta, as directed on packaging, as needed for discomfort.  Take the prescription as directed.  Call your regular GI doctor on Monday to schedule a follow up appointment within the next week.  Return to the Emergency Department immediately if worsening.

## 2014-06-09 NOTE — ED Notes (Signed)
Cramping upper abdominal pain x 2 weeks. Was supposed to have [place] last week for this, ordered by GI MD who he sees for reflux. Gas pain and cramping keeping him from sleeping. Anxious. No n,v,d or constipation.  Both feet hurt as well. Had cortisone inject which 'worked for 2 days.' Now pain is back.

## 2014-06-15 ENCOUNTER — Other Ambulatory Visit: Payer: Self-pay

## 2014-06-22 ENCOUNTER — Ambulatory Visit: Payer: BLUE CROSS/BLUE SHIELD

## 2014-06-28 ENCOUNTER — Ambulatory Visit (INDEPENDENT_AMBULATORY_CARE_PROVIDER_SITE_OTHER): Payer: Self-pay | Admitting: General Surgery

## 2014-07-10 ENCOUNTER — Encounter (HOSPITAL_COMMUNITY): Payer: Self-pay | Admitting: *Deleted

## 2014-07-10 MED ORDER — CEFAZOLIN SODIUM-DEXTROSE 2-3 GM-% IV SOLR
2.0000 g | INTRAVENOUS | Status: AC
  Administered 2014-07-11: 2 g via INTRAVENOUS
  Filled 2014-07-10: qty 50

## 2014-07-10 MED ORDER — CHLORHEXIDINE GLUCONATE 4 % EX LIQD
1.0000 | Freq: Once | CUTANEOUS | Status: DC
Start: 2014-07-11 — End: 2014-07-11
  Filled 2014-07-10: qty 15

## 2014-07-11 ENCOUNTER — Encounter (HOSPITAL_COMMUNITY): Payer: Self-pay | Admitting: *Deleted

## 2014-07-11 ENCOUNTER — Ambulatory Visit (HOSPITAL_COMMUNITY)
Admission: RE | Admit: 2014-07-11 | Discharge: 2014-07-11 | Disposition: A | Discharge status: Home / Self Care | Payer: BLUE CROSS/BLUE SHIELD | Source: Ambulatory Visit | Attending: General Surgery | Admitting: General Surgery

## 2014-07-11 ENCOUNTER — Encounter (HOSPITAL_COMMUNITY)
Admission: RE | Disposition: A | Discharge status: Home / Self Care | Payer: Self-pay | Source: Ambulatory Visit | Attending: General Surgery

## 2014-07-11 ENCOUNTER — Ambulatory Visit (HOSPITAL_COMMUNITY): Payer: BLUE CROSS/BLUE SHIELD | Admitting: Certified Registered Nurse Anesthetist

## 2014-07-11 DIAGNOSIS — F329 Major depressive disorder, single episode, unspecified: Secondary | ICD-10-CM | POA: Insufficient documentation

## 2014-07-11 DIAGNOSIS — K219 Gastro-esophageal reflux disease without esophagitis: Secondary | ICD-10-CM | POA: Diagnosis not present

## 2014-07-11 DIAGNOSIS — Z91041 Radiographic dye allergy status: Secondary | ICD-10-CM | POA: Insufficient documentation

## 2014-07-11 DIAGNOSIS — K8020 Calculus of gallbladder without cholecystitis without obstruction: Secondary | ICD-10-CM | POA: Insufficient documentation

## 2014-07-11 DIAGNOSIS — Z87442 Personal history of urinary calculi: Secondary | ICD-10-CM | POA: Diagnosis not present

## 2014-07-11 DIAGNOSIS — F419 Anxiety disorder, unspecified: Secondary | ICD-10-CM | POA: Diagnosis not present

## 2014-07-11 DIAGNOSIS — J45909 Unspecified asthma, uncomplicated: Secondary | ICD-10-CM | POA: Insufficient documentation

## 2014-07-11 DIAGNOSIS — F1099 Alcohol use, unspecified with unspecified alcohol-induced disorder: Secondary | ICD-10-CM | POA: Insufficient documentation

## 2014-07-11 HISTORY — DX: Psoriasis, unspecified: L40.9

## 2014-07-11 HISTORY — PX: CHOLECYSTECTOMY: SHX55

## 2014-07-11 HISTORY — DX: Anxiety disorder, unspecified: F41.9

## 2014-07-11 HISTORY — DX: Unspecified asthma, uncomplicated: J45.909

## 2014-07-11 HISTORY — DX: Calculus of kidney: N20.0

## 2014-07-11 LAB — CBC
HCT: 42 % (ref 39.0–52.0)
Hemoglobin: 14.5 g/dL (ref 13.0–17.0)
MCH: 32.1 pg (ref 26.0–34.0)
MCHC: 34.5 g/dL (ref 30.0–36.0)
MCV: 92.9 fL (ref 78.0–100.0)
PLATELETS: 270 10*3/uL (ref 150–400)
RBC: 4.52 MIL/uL (ref 4.22–5.81)
RDW: 12.4 % (ref 11.5–15.5)
WBC: 8.3 10*3/uL (ref 4.0–10.5)

## 2014-07-11 SURGERY — LAPAROSCOPIC CHOLECYSTECTOMY WITH INTRAOPERATIVE CHOLANGIOGRAM
Anesthesia: General | Site: Abdomen

## 2014-07-11 MED ORDER — 0.9 % SODIUM CHLORIDE (POUR BTL) OPTIME
TOPICAL | Status: DC | PRN
  Administered 2014-07-11: 1000 mL

## 2014-07-11 MED ORDER — DIAZEPAM 5 MG PO TABS: 5.0000 mg | ORAL_TABLET | Freq: Two times a day (BID) | ORAL | Status: DC | PRN

## 2014-07-11 MED ORDER — SCOPOLAMINE 1 MG/3DAYS TD PT72
1.0000 | MEDICATED_PATCH | TRANSDERMAL | Status: DC
  Administered 2014-07-11: 1.5 mg via TRANSDERMAL
  Filled 2014-07-11: qty 1

## 2014-07-11 MED ORDER — SODIUM CHLORIDE 0.9 % IR SOLN
Status: DC | PRN
  Administered 2014-07-11: 1000 mL

## 2014-07-11 MED ORDER — BUPIVACAINE-EPINEPHRINE (PF) 0.25% -1:200000 IJ SOLN
INTRAMUSCULAR | Status: AC
  Filled 2014-07-11: qty 30

## 2014-07-11 MED ORDER — LACTATED RINGERS IV SOLN
INTRAVENOUS | Status: DC
  Administered 2014-07-11 (×2): via INTRAVENOUS

## 2014-07-11 MED ORDER — ONDANSETRON HCL 4 MG/2ML IJ SOLN
INTRAMUSCULAR | Status: DC | PRN
  Administered 2014-07-11: 4 mg via INTRAVENOUS

## 2014-07-11 MED ORDER — HYDROMORPHONE HCL 1 MG/ML IJ SOLN
INTRAMUSCULAR | Status: AC
  Administered 2014-07-11: 0.5 mg via INTRAVENOUS
  Filled 2014-07-11: qty 2

## 2014-07-11 MED ORDER — PHENYLEPHRINE 40 MCG/ML (10ML) SYRINGE FOR IV PUSH (FOR BLOOD PRESSURE SUPPORT)
PREFILLED_SYRINGE | INTRAVENOUS | Status: AC
  Filled 2014-07-11: qty 10

## 2014-07-11 MED ORDER — NEOSTIGMINE METHYLSULFATE 10 MG/10ML IV SOLN
INTRAVENOUS | Status: AC
  Filled 2014-07-11: qty 1

## 2014-07-11 MED ORDER — BUPIVACAINE-EPINEPHRINE 0.25% -1:200000 IJ SOLN
INTRAMUSCULAR | Status: DC | PRN
  Administered 2014-07-11: 24 mL

## 2014-07-11 MED ORDER — OXYCODONE HCL 5 MG PO TABS: 5.0000 mg | ORAL_TABLET | ORAL | Status: DC | PRN

## 2014-07-11 MED ORDER — DIAZEPAM 5 MG PO TABS
5.0000 mg | ORAL_TABLET | Freq: Once | ORAL | Status: AC
  Administered 2014-07-11: 5 mg via ORAL

## 2014-07-11 MED ORDER — ONDANSETRON HCL 4 MG/2ML IJ SOLN
INTRAMUSCULAR | Status: AC
  Filled 2014-07-11: qty 2

## 2014-07-11 MED ORDER — MIDAZOLAM HCL 2 MG/2ML IJ SOLN
INTRAMUSCULAR | Status: AC
  Filled 2014-07-11: qty 2

## 2014-07-11 MED ORDER — NEOSTIGMINE METHYLSULFATE 10 MG/10ML IV SOLN
INTRAVENOUS | Status: DC | PRN
  Administered 2014-07-11: 4 mg via INTRAVENOUS

## 2014-07-11 MED ORDER — HYDROMORPHONE HCL 1 MG/ML IJ SOLN
INTRAMUSCULAR | Status: AC
  Administered 2014-07-11: 0.5 mg via INTRAVENOUS
  Filled 2014-07-11: qty 1

## 2014-07-11 MED ORDER — DIAZEPAM 5 MG PO TABS
ORAL_TABLET | ORAL | Status: AC
  Administered 2014-07-11: 5 mg via ORAL
  Filled 2014-07-11: qty 1

## 2014-07-11 MED ORDER — EPHEDRINE SULFATE 50 MG/ML IJ SOLN
INTRAMUSCULAR | Status: DC | PRN
  Administered 2014-07-11: 5 mg via INTRAVENOUS

## 2014-07-11 MED ORDER — ROCURONIUM BROMIDE 50 MG/5ML IV SOLN
INTRAVENOUS | Status: AC
  Filled 2014-07-11: qty 1

## 2014-07-11 MED ORDER — FENTANYL CITRATE 0.05 MG/ML IJ SOLN
INTRAMUSCULAR | Status: AC
  Filled 2014-07-11: qty 5

## 2014-07-11 MED ORDER — PROPOFOL 10 MG/ML IV BOLUS
INTRAVENOUS | Status: DC | PRN
  Administered 2014-07-11: 30 mg via INTRAVENOUS
  Administered 2014-07-11: 170 mg via INTRAVENOUS

## 2014-07-11 MED ORDER — PHENYLEPHRINE HCL 10 MG/ML IJ SOLN
INTRAMUSCULAR | Status: DC | PRN
  Administered 2014-07-11 (×3): 80 ug via INTRAVENOUS

## 2014-07-11 MED ORDER — PROMETHAZINE HCL 25 MG/ML IJ SOLN: 6.2500 mg | INTRAMUSCULAR | Status: DC | PRN

## 2014-07-11 MED ORDER — ROCURONIUM BROMIDE 100 MG/10ML IV SOLN
INTRAVENOUS | Status: DC | PRN
  Administered 2014-07-11: 30 mg via INTRAVENOUS
  Administered 2014-07-11: 10 mg via INTRAVENOUS

## 2014-07-11 MED ORDER — PROPOFOL 10 MG/ML IV BOLUS
INTRAVENOUS | Status: AC
  Filled 2014-07-11: qty 20

## 2014-07-11 MED ORDER — LIDOCAINE HCL (CARDIAC) 20 MG/ML IV SOLN
INTRAVENOUS | Status: AC
  Filled 2014-07-11: qty 5

## 2014-07-11 MED ORDER — NEOSTIGMINE METHYLSULFATE 10 MG/10ML IV SOLN
INTRAVENOUS | Status: AC
  Filled 2014-07-11: qty 2

## 2014-07-11 MED ORDER — GLYCOPYRROLATE 0.2 MG/ML IJ SOLN
INTRAMUSCULAR | Status: AC
  Filled 2014-07-11: qty 2

## 2014-07-11 MED ORDER — DEXAMETHASONE SODIUM PHOSPHATE 4 MG/ML IJ SOLN
INTRAMUSCULAR | Status: AC
  Filled 2014-07-11: qty 1

## 2014-07-11 MED ORDER — HYDROMORPHONE HCL 1 MG/ML IJ SOLN
INTRAMUSCULAR | Status: AC
Start: 2014-07-11 — End: 2014-07-11
  Administered 2014-07-11: 0.5 mg via INTRAVENOUS
  Filled 2014-07-11: qty 1

## 2014-07-11 MED ORDER — OXYCODONE HCL 5 MG PO TABS
ORAL_TABLET | ORAL | Status: AC
  Administered 2014-07-11: 5 mg via ORAL
  Filled 2014-07-11: qty 1

## 2014-07-11 MED ORDER — GLYCOPYRROLATE 0.2 MG/ML IJ SOLN
INTRAMUSCULAR | Status: AC
  Filled 2014-07-11: qty 3

## 2014-07-11 MED ORDER — KETOROLAC TROMETHAMINE 30 MG/ML IJ SOLN
INTRAMUSCULAR | Status: AC
  Filled 2014-07-11: qty 1

## 2014-07-11 MED ORDER — DEXAMETHASONE SODIUM PHOSPHATE 4 MG/ML IJ SOLN
INTRAMUSCULAR | Status: DC | PRN
  Administered 2014-07-11: 4 mg via INTRAVENOUS

## 2014-07-11 MED ORDER — HYDROMORPHONE HCL 1 MG/ML IJ SOLN
0.2500 mg | INTRAMUSCULAR | Status: DC | PRN
  Administered 2014-07-11 (×2): 0.5 mg via INTRAVENOUS

## 2014-07-11 MED ORDER — OXYCODONE HCL 5 MG PO TABS
5.0000 mg | ORAL_TABLET | Freq: Once | ORAL | Status: AC | PRN
  Administered 2014-07-11: 5 mg via ORAL

## 2014-07-11 MED ORDER — MIDAZOLAM HCL 5 MG/5ML IJ SOLN
INTRAMUSCULAR | Status: DC | PRN
  Administered 2014-07-11: 2 mg via INTRAVENOUS

## 2014-07-11 MED ORDER — HYDROMORPHONE HCL 1 MG/ML IJ SOLN
0.2500 mg | INTRAMUSCULAR | Status: DC | PRN
  Administered 2014-07-11 (×4): 0.5 mg via INTRAVENOUS

## 2014-07-11 MED ORDER — GLYCOPYRROLATE 0.2 MG/ML IJ SOLN
INTRAMUSCULAR | Status: DC | PRN
  Administered 2014-07-11: 6 mg via INTRAVENOUS

## 2014-07-11 MED ORDER — OXYCODONE HCL 5 MG/5ML PO SOLN: 5.0000 mg | Freq: Once | ORAL | Status: AC | PRN

## 2014-07-11 MED ORDER — FENTANYL CITRATE 0.05 MG/ML IJ SOLN
INTRAMUSCULAR | Status: DC | PRN
  Administered 2014-07-11 (×2): 25 ug via INTRAVENOUS
  Administered 2014-07-11 (×5): 50 ug via INTRAVENOUS

## 2014-07-11 SURGICAL SUPPLY — 44 items
APPLIER CLIP 5 13 M/L LIGAMAX5 (MISCELLANEOUS) ×2
APR CLP MED LRG 5 ANG JAW (MISCELLANEOUS) ×1
BAG SPEC RTRVL 10 TROC 200 (ENDOMECHANICALS) ×1
BLADE SURG ROTATE 9660 (MISCELLANEOUS) ×1 IMPLANT
CANISTER SUCTION 2500CC (MISCELLANEOUS) ×2 IMPLANT
CHLORAPREP W/TINT 26ML (MISCELLANEOUS) ×2 IMPLANT
CLIP APPLIE 5 13 M/L LIGAMAX5 (MISCELLANEOUS) ×1 IMPLANT
COVER MAYO STAND STRL (DRAPES) ×1 IMPLANT
COVER SURGICAL LIGHT HANDLE (MISCELLANEOUS) ×2 IMPLANT
DRAPE C-ARM 42X72 X-RAY (DRAPES) ×1 IMPLANT
DRAPE LAPAROSCOPIC ABDOMINAL (DRAPES) ×2 IMPLANT
ELECT REM PT RETURN 9FT ADLT (ELECTROSURGICAL) ×2
ELECTRODE REM PT RTRN 9FT ADLT (ELECTROSURGICAL) ×1 IMPLANT
FILTER SMOKE EVAC LAPAROSHD (FILTER) ×1 IMPLANT
GLOVE BIO SURGEON STRL SZ 6.5 (GLOVE) ×1 IMPLANT
GLOVE BIO SURGEON STRL SZ7 (GLOVE) ×1 IMPLANT
GLOVE BIO SURGEON STRL SZ8 (GLOVE) ×2 IMPLANT
GLOVE BIOGEL PI IND STRL 7.0 (GLOVE) IMPLANT
GLOVE BIOGEL PI IND STRL 8 (GLOVE) ×1 IMPLANT
GLOVE BIOGEL PI INDICATOR 7.0 (GLOVE) ×3
GLOVE BIOGEL PI INDICATOR 8 (GLOVE) ×1
GOWN STRL REUS W/ TWL LRG LVL3 (GOWN DISPOSABLE) ×2 IMPLANT
GOWN STRL REUS W/ TWL XL LVL3 (GOWN DISPOSABLE) ×1 IMPLANT
GOWN STRL REUS W/TWL LRG LVL3 (GOWN DISPOSABLE) ×4
GOWN STRL REUS W/TWL XL LVL3 (GOWN DISPOSABLE) ×2
KIT BASIN OR (CUSTOM PROCEDURE TRAY) ×2 IMPLANT
KIT ROOM TURNOVER OR (KITS) ×2 IMPLANT
LIQUID BAND (GAUZE/BANDAGES/DRESSINGS) ×2 IMPLANT
NS IRRIG 1000ML POUR BTL (IV SOLUTION) ×2 IMPLANT
PAD ARMBOARD 7.5X6 YLW CONV (MISCELLANEOUS) ×2 IMPLANT
POUCH RETRIEVAL ECOSAC 10 (ENDOMECHANICALS) ×1 IMPLANT
POUCH RETRIEVAL ECOSAC 10MM (ENDOMECHANICALS) ×1
SCISSORS LAP 5X35 DISP (ENDOMECHANICALS) ×2 IMPLANT
SET CHOLANGIOGRAPH 5 50 .035 (SET/KITS/TRAYS/PACK) ×1 IMPLANT
SET IRRIG TUBING LAPAROSCOPIC (IRRIGATION / IRRIGATOR) ×2 IMPLANT
SLEEVE ENDOPATH XCEL 5M (ENDOMECHANICALS) ×4 IMPLANT
SPECIMEN JAR SMALL (MISCELLANEOUS) ×2 IMPLANT
SUT VIC AB 4-0 PS2 27 (SUTURE) ×2 IMPLANT
TOWEL OR 17X24 6PK STRL BLUE (TOWEL DISPOSABLE) ×2 IMPLANT
TOWEL OR 17X26 10 PK STRL BLUE (TOWEL DISPOSABLE) ×1 IMPLANT
TRAY LAPAROSCOPIC (CUSTOM PROCEDURE TRAY) ×2 IMPLANT
TROCAR XCEL BLUNT TIP 100MML (ENDOMECHANICALS) ×2 IMPLANT
TROCAR XCEL NON-BLD 5MMX100MML (ENDOMECHANICALS) ×2 IMPLANT
TUBING INSUFFLATION (TUBING) ×2 IMPLANT

## 2014-07-11 NOTE — H&P (Signed)
History of Present Illness Scott Dominguez E. Grandville Silos MD; 06/28/2014 9:25 AM) Patient words: Scott Dominguez.  The patient is a 59 year old male who presents for evaluation of gallbladder disease. Scott Dominguez has a many year history of intermittent epigastric abdominal pain. This usually comes on several hours after eating a fatty meal. At times, it wakes him up at night. Occasionally he does have associated nausea and vomiting for a couple of hours. It is always spontaneously resolve. Recently, after a severe attack, he was evaluated in the emergency department. Abdominal ultrasound reveals sludge and microlithiasis in the gallbladder. There was no evidence of acute cholecystitis at that time. I was asked to see him by Dr. Cristina Gong for consultation and consideration of cholecystectomy. He also requests a small prescription for Valium for the time leading up to surgery due to his anxiety. He has tolerated this very well in the past with a 5 mg dose.   Other Problems [name], LPN; 7/35/3299 2:42 AM) Anxiety Disorder Asthma Depression Gastroesophageal Reflux Disease Kidney Stone  Past Surgical History [name], LPN; 6/83/4196 2:22 AM) Appendectomy  Diagnostic Studies History [name], LPN; 9/79/8921 1:94 AM) Colonoscopy 1-5 years ago  Allergies [name], LPN; 1/74/0814 4:81 AM) Contrast Media Ready-Box *MEDICAL DEVICES AND SUPPLIES*  Medication History [name], LPN; 8/56/3149 7:02 AM) Pantoprazole Sodium (40MG  Tablet DR, Oral daily) Active. Enbrel SureClick (50MG /ML Soln Auto-inj, Subcutaneous) Active.  Social History [name], LPN; 6/37/8588 5:02 AM) Alcohol use Occasional alcohol use. Caffeine use Carbonated beverages, Tea. No drug use Tobacco use Never smoker.  Family History [name], LPN; 7/74/1287 8:67 AM) Alcohol Abuse Father. Ischemic Bowel Disease Mother.  Review of Systems [name] [facility] LPN; 6/72/0947 0:96  AM) General Present- Chills, Fatigue and Night Sweats. Not Present- Appetite Loss, Fever, Weight Gain and Weight Loss. Skin Not Present- Change in Wart/Mole, Dryness, Hives, Jaundice, Scott Lesions, Non-Healing Wounds, Rash and Ulcer. HEENT Present- Ringing in the Ears. Not Present- Earache, Hearing Loss, Hoarseness, Nose Bleed, Oral Ulcers, Seasonal Allergies, Sinus Pain, Sore Throat, Visual Disturbances, Wears glasses/contact lenses and Yellow Eyes. Respiratory Not Present- Bloody sputum, Chronic Cough, Difficulty Breathing, Snoring and Wheezing. Breast Not Present- Breast Mass, Breast Pain, Nipple Discharge and Skin Changes. Cardiovascular Not Present- Chest Pain, Difficulty Breathing Lying Down, Leg Cramps, Palpitations, Rapid Heart Rate, Shortness of Breath and Swelling of Extremities. Gastrointestinal Present- Abdominal Pain and Excessive gas. Not Present- Bloating, Bloody Stool, Change in Bowel Habits, Chronic diarrhea, Constipation, Difficulty Swallowing, Gets full quickly at meals, Hemorrhoids, Indigestion, Nausea, Rectal Pain and Vomiting. Male Genitourinary Not Present- Blood in Urine, Change in Urinary Stream, Frequency, Impotence, Nocturia, Painful Urination, Urgency and Urine Leakage.   Vitals [name] [facility] LPN; 2/83/6629 4:76 AM) 06/28/2014 9:05 AM Weight: 188 lb Height: 71in Body Surface Area: 2.07 m Body Mass Index: 26.22 kg/m Temp.: 98.80F(Oral)  Pulse: 64 (Regular)  BP: 130/78 (Sitting, Right Arm, Standard)    Physical Exam Scott Dominguez E. Grandville Silos MD; 06/28/2014 9:24 AM) General Mental Status-Alert. General Appearance-Consistent with stated age. Hydration-Well hydrated. Voice-Normal.  Head and Neck Head-normocephalic, atraumatic with no lesions or palpable masses.  Eye Eyeball - Bilateral-Extraocular movements intact. Sclera/Conjunctiva - Bilateral-No scleral icterus.  Chest and Lung Exam Chest and lung exam reveals -quiet, even and easy  respiratory effort with no use of accessory muscles and on auscultation, normal breath sounds, no adventitious sounds and normal vocal resonance. Inspection Chest Wall - Normal. Back - normal.  Cardiovascular Cardiovascular examination reveals -on palpation PMI is normal in location and  amplitude, no palpable S3 or S4. Normal cardiac borders., normal heart sounds, regular rate and rhythm with no murmurs, carotid auscultation reveals no bruits and normal pedal pulses bilaterally.  Abdomen Inspection Inspection of the abdomen reveals - No Hernias. Skin - Scar - Right Lower Quadrant. Palpation/Percussion Palpation and Percussion of the abdomen reveal - Soft, No Rebound tenderness, No Rigidity (guarding) and No hepatosplenomegaly. Tenderness - Epigastrium. Auscultation Auscultation of the abdomen reveals - Bowel sounds normal.  Neurologic Neurologic evaluation reveals -alert and oriented x 3 with no impairment of recent or remote memory. Mental Status-Normal.  Musculoskeletal Normal Exam - Left-Upper Extremity Strength Normal and Lower Extremity Strength Normal. Normal Exam - Right-Upper Extremity Strength Normal and Lower Extremity Strength Normal.    Assessment & Plan Scott Dominguez E. Grandville Silos MD; 06/28/2014 9:27 AM) SYMPTOMATIC CHOLELITHIASIS (574.20  K80.20) Impression: I have offered laparoscopic cholecystectomy with intraoperative cholangiogram. Procedure, risks, and benefits were discussed in detail with Scott Dominguez and his wife including the possibility of conversion to open procedure if necessary. He is agreeable. ANXIETY (300.00  F41.9) Current Plans  Started Valium 5MG , 1 (one) Tablet daily when necessary, #10, 06/28/2014, No Refill.  [name], MD, MPH, FACS Trauma: [phone] General Surgery: [phone]

## 2014-07-11 NOTE — Transfer of Care (Signed)
Immediate Anesthesia Transfer of Care Note  Patient: Scott Dominguez  Procedure(s) Performed: Procedure(s): LAPAROSCOPIC CHOLECYSTECTOMY (N/A)  Patient Location: PACU  Anesthesia Type:General  Level of Consciousness: awake, alert  and oriented  Airway & Oxygen Therapy: Patient Spontanous Breathing and Patient connected to nasal cannula oxygen  Post-op Assessment: Report given to RN and Post -op Vital signs reviewed and stable  Post vital signs: Reviewed and stable  Last Vitals:  Filed Vitals:   07/11/14 1014  BP: 145/92  Pulse: 81  Temp: 36.7 C  Resp: 20    Complications: No apparent anesthesia complications

## 2014-07-11 NOTE — Interval H&P Note (Signed)
History and Physical Interval Note:  07/11/2014 12:28 PM  Scott Dominguez  has presented today for surgery, with the diagnosis of SYMPTOMATIC CHOLELITHIASIS  The various methods of treatment have been discussed with the patient and family. After consideration of risks, benefits and other options for treatment, the patient has consented to  Procedure(s): LAPAROSCOPIC CHOLECYSTECTOMY WITH INTRAOPERATIVE CHOLANGIOGRAM (N/A) as a surgical intervention .  The patient's history has been reviewed, patient re-examined, no change in status, stable for surgery.  I have reviewed the patient's chart and labs.  Questions were answered to the patient's satisfaction.     [name] E

## 2014-07-11 NOTE — Progress Notes (Signed)
Pt arrived from PACU via [place], pt very drowsy, denies pain, slight nausea but is sipping fluids. Family bedside, pt resting without complaint.

## 2014-07-11 NOTE — Anesthesia Postprocedure Evaluation (Signed)
Anesthesia Post Note  Patient: Scott Dominguez  Procedure(s) Performed: Procedure(s) (LRB): LAPAROSCOPIC CHOLECYSTECTOMY (N/A)  Anesthesia type: general  Patient location: PACU  Post pain: Pain level controlled  Post assessment: Patient's Cardiovascular Status Stable  Last Vitals:  Filed Vitals:   07/11/14 1415  BP:   Pulse: 58  Temp:   Resp: 13    Post vital signs: Reviewed and stable  Level of consciousness: sedated  Complications: No apparent anesthesia complications

## 2014-07-11 NOTE — Op Note (Signed)
07/11/2014  1:28 PM  PATIENT:  Scott Dominguez  59 y.o. male  PRE-OPERATIVE DIAGNOSIS:  SYMPTOMATIC CHOLELITHIASIS  POST-OPERATIVE DIAGNOSIS:  SYMPTOMATIC CHOLELITHIASIS  PROCEDURE:  Procedure(s): LAPAROSCOPIC CHOLECYSTECTOMY  SURGEON:  Surgeon(s): [name], MD  ASSISTANTS: none   ANESTHESIA:   local and general  EBL:  Total I/O In: 900 [I.V.:900] Out: -   BLOOD ADMINISTERED:none  DRAINS: none   SPECIMEN:  Excision  DISPOSITION OF SPECIMEN:  PATHOLOGY  COUNTS:  YES  DICTATION: .Dragon Dictation Scott Dominguez presents for laparoscopic cholecystectomy. He was identified in the preop holding area. Informed consent was obtained. He received intravenous antibiotics. He was brought to the operating room and general endotracheal anesthesia was administered by the anesthesia staff. His abdomen was prepped and draped in a sterile fashion. We did a time out procedure. Infraumbilical region was infiltrated with local and an infraumbilical incision was made. Subcutaneous tissues were dissected down revealing the anterior fascia. This was divided along the midline. Cavity was entered under direct vision carefully. 0 Vicryl pursestring was placed on the fascial opening. Hassan trocar was inserted. Abdomen was insufflated with carbon dioxide in standard fashion. Under direct vision, 5 mm epigastric and 5 mm lateral port 2 were placed. Local was used at all port site. Laparoscopic exploration revealed chronic inflammatory changes to the gallbladder with a lot of omental adhesions. The dome was retracted superior medially. Omental adhesions were gradually taken down using cautery revealing the infundibulum. The infundibulum was retracted inferior laterally. Dissection began laterally and progressed medially easily identifying the cystic duct and the anterior branch of the cystic artery. First the cystic artery was dissected out, clipped twice proximally, once distally and divided. The cystic duct was  further dissected until critical view was achieved between the cystic duct, and the infundibulum, and the liver. Once we had excellent visualization, decision was made to not do a cholangiogram due to his allergy status and normal liver function tests. 3 clips were placed proximally on the cystic duct, one was placed distally and it was divided. Gallbladder was taken off the liver bed with Bovie cautery. A posterior branch of the cystic artery was encountered, which was small. This was clipped twice proximally and divided distally with cautery. Gallbladder was taken rest away of the liver bed with cautery. It was placed in an ecosac and removed from the abdomen via the infraumbilical port site. Liver bed was copiously irrigated and hemostasis was ensured. Clips remain in good position in the liver bed was dry. Ports were removed under direct vision. Pneumoperitoneum was released. Informed local fascia was closed by tying the pursestring suture. All 4 wounds were copiously irrigated and the skin of each was closed with running 4 Vicryl subcuticular followed by liquid band. All counts were correct. He tolerated the procedure well without apparent complication was taken recovery in stable condition.  PATIENT DISPOSITION:  PACU - hemodynamically stable.   Delay start of Pharmacological VTE agent (>24hrs) due to surgical blood loss or risk of bleeding:  no  [name], MD, MPH, FACS Pager: [phone]  3/23/20161:28 PM

## 2014-07-11 NOTE — Anesthesia Preprocedure Evaluation (Signed)
Anesthesia Evaluation  Patient identified by MRN, date of birth, ID band Patient awake    Reviewed: Allergy & Precautions, NPO status , Patient's Chart, lab work & pertinent test results  History of Anesthesia Complications Negative for: history of anesthetic complications  Airway Mallampati: II  TM Distance: >3 FB Neck ROM: Full    Dental  (+) Teeth Intact, Dental Advisory Given   Pulmonary asthma ,    Pulmonary exam normal       Cardiovascular negative cardio ROS      Neuro/Psych  Headaches, PSYCHIATRIC DISORDERS Anxiety Depression    GI/Hepatic Neg liver ROS, GERD-  Medicated,  Endo/Other  negative endocrine ROS  Renal/GU negative Renal ROS     Musculoskeletal   Abdominal   Peds  Hematology   Anesthesia Other Findings   Reproductive/Obstetrics                             Anesthesia Physical Anesthesia Plan  ASA: II  Anesthesia Plan: General   Post-op Pain Management:    Induction: Intravenous  Airway Management Planned: Oral ETT  Additional Equipment:   Intra-op Plan:   Post-operative Plan: Extubation in OR  Informed Consent: I have reviewed the patients History and Physical, chart, labs and discussed the procedure including the risks, benefits and alternatives for the proposed anesthesia with the patient or authorized representative who has indicated his/her understanding and acceptance.   Dental advisory given  Plan Discussed with: CRNA, Anesthesiologist and Surgeon  Anesthesia Plan Comments:         Anesthesia Quick Evaluation

## 2014-07-11 NOTE — Anesthesia Procedure Notes (Signed)
Procedure Name: Intubation Date/Time: 07/11/2014 12:59 PM Performed by: Merdis Delay Pre-anesthesia Checklist: Patient identified, Timeout performed, Emergency Drugs available, Suction available and Patient being monitored Patient Re-evaluated:Patient Re-evaluated prior to inductionOxygen Delivery Method: Circle system utilized Preoxygenation: Pre-oxygenation with 100% oxygen Intubation Type: IV induction Ventilation: Mask ventilation without difficulty Laryngoscope Size: Mac and 4 Grade View: Grade II Tube type: Oral Tube size: 7.5 mm Number of attempts: 1 Airway Equipment and Method: Stylet Placement Confirmation: ETT inserted through vocal cords under direct vision,  breath sounds checked- equal and bilateral,  positive ETCO2 and CO2 detector Secured at: 21 cm Tube secured with: Tape Dental Injury: Teeth and Oropharynx as per pre-operative assessment

## 2014-07-16 ENCOUNTER — Encounter (HOSPITAL_COMMUNITY): Payer: Self-pay | Admitting: General Surgery

## 2014-08-14 ENCOUNTER — Ambulatory Visit (INDEPENDENT_AMBULATORY_CARE_PROVIDER_SITE_OTHER): Payer: BLUE CROSS/BLUE SHIELD | Admitting: Podiatry

## 2014-08-14 ENCOUNTER — Encounter: Payer: Self-pay | Admitting: Podiatry

## 2014-08-14 VITALS — BP 115/71 | HR 78 | Resp 16

## 2014-08-14 DIAGNOSIS — G5750 Tarsal tunnel syndrome, unspecified lower limb: Secondary | ICD-10-CM | POA: Diagnosis not present

## 2014-08-14 MED ORDER — METHYLPREDNISOLONE 4 MG PO TBPK: ORAL_TABLET | ORAL | Status: DC

## 2014-08-14 NOTE — Progress Notes (Signed)
He presents today for follow-up with bilateral foot pain. He states that his feet just seem to be getting worse with radiating pain from the medial heel extending to the level of the toes and up the leg. 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 He also states that he has a history of psoriasis with knee pain in other joints as well. He has no history of back pain or problems. Relates that the pain in the feet can be sharp and shooting or pins and needles.  Objective: Vital signs are stable he is alert and oriented 3. Pulses are palpable bilateral. He is tender on palpation of the tibial artery due to the proximity of the tibial nerve. He has pain on palpation of the posterior tibial nerve as well as percussion. Positive Tinel sign is noted. Deep tendon reflexes are intact bilaterally muscle strength appears to be normal. He has some mild tenderness on palpation of the medial calcaneal tubercle bilateral.  Assessment: Tarsal tunnel bilateral.  Plan: Discussed etiology pathology conservative or surgical therapies. At this point we will start him on a Medrol Dosepak to be followed by meloxicam. I will follow-up with him in 4-6 weeks. May consider a nerve conduction velocity exam at that time.

## 2014-09-11 ENCOUNTER — Ambulatory Visit: Payer: BLUE CROSS/BLUE SHIELD | Admitting: Podiatry

## 2014-12-07 ENCOUNTER — Other Ambulatory Visit: Payer: Self-pay | Admitting: Urology

## 2014-12-14 ENCOUNTER — Encounter (HOSPITAL_COMMUNITY): Payer: Self-pay | Admitting: *Deleted

## 2014-12-17 ENCOUNTER — Encounter (HOSPITAL_COMMUNITY): Payer: Self-pay | Admitting: *Deleted

## 2014-12-17 ENCOUNTER — Encounter (HOSPITAL_COMMUNITY)
Admission: RE | Disposition: A | Discharge status: Home / Self Care | Payer: Self-pay | Source: Ambulatory Visit | Attending: Urology

## 2014-12-17 ENCOUNTER — Ambulatory Visit (HOSPITAL_COMMUNITY): Payer: BLUE CROSS/BLUE SHIELD

## 2014-12-17 ENCOUNTER — Ambulatory Visit (HOSPITAL_COMMUNITY)
Admission: RE | Admit: 2014-12-17 | Discharge: 2014-12-17 | Disposition: A | Discharge status: Home / Self Care | Payer: BLUE CROSS/BLUE SHIELD | Source: Ambulatory Visit | Attending: Urology | Admitting: Urology

## 2014-12-17 DIAGNOSIS — E780 Pure hypercholesterolemia: Secondary | ICD-10-CM | POA: Insufficient documentation

## 2014-12-17 DIAGNOSIS — Z79899 Other long term (current) drug therapy: Secondary | ICD-10-CM | POA: Diagnosis not present

## 2014-12-17 DIAGNOSIS — J45909 Unspecified asthma, uncomplicated: Secondary | ICD-10-CM | POA: Insufficient documentation

## 2014-12-17 DIAGNOSIS — R109 Unspecified abdominal pain: Secondary | ICD-10-CM | POA: Diagnosis present

## 2014-12-17 DIAGNOSIS — N201 Calculus of ureter: Secondary | ICD-10-CM

## 2014-12-17 DIAGNOSIS — N200 Calculus of kidney: Secondary | ICD-10-CM | POA: Insufficient documentation

## 2014-12-17 DIAGNOSIS — K219 Gastro-esophageal reflux disease without esophagitis: Secondary | ICD-10-CM | POA: Insufficient documentation

## 2014-12-17 HISTORY — PX: LITHOTRIPSY: SUR834

## 2014-12-17 SURGERY — LITHOTRIPSY, ESWL
Anesthesia: LOCAL | Laterality: Right

## 2014-12-17 MED ORDER — OXYCODONE HCL 5 MG PO TABS: 5.0000 mg | ORAL_TABLET | ORAL | Status: AC | PRN

## 2014-12-17 MED ORDER — CIPROFLOXACIN HCL 500 MG PO TABS
500.0000 mg | ORAL_TABLET | ORAL | Status: AC
  Administered 2014-12-17: 500 mg via ORAL
  Filled 2014-12-17: qty 1

## 2014-12-17 MED ORDER — SODIUM CHLORIDE 0.9 % IV SOLN
INTRAVENOUS | Status: DC
  Administered 2014-12-17: 07:00:00 via INTRAVENOUS

## 2014-12-17 MED ORDER — DIPHENHYDRAMINE HCL 25 MG PO CAPS
25.0000 mg | ORAL_CAPSULE | ORAL | Status: AC
  Administered 2014-12-17: 25 mg via ORAL
  Filled 2014-12-17: qty 1

## 2014-12-17 MED ORDER — TAMSULOSIN HCL 0.4 MG PO CAPS: 0.4000 mg | ORAL_CAPSULE | Freq: Every day | ORAL | Status: AC

## 2014-12-17 MED ORDER — DIAZEPAM 5 MG PO TABS
10.0000 mg | ORAL_TABLET | ORAL | Status: AC
  Administered 2014-12-17: 10 mg via ORAL
  Filled 2014-12-17: qty 2

## 2014-12-17 NOTE — Op Note (Signed)
See [facility] OP note scanned into chart. Also because of the size, density, location and other factors that cannot be anticipated I feel this will likely be a staged procedure. This fact supersedes any indication in the scanned [facility] operative note to the contrary.  

## 2014-12-17 NOTE — Discharge Instructions (Signed)
See [facility] discharge instructions in chart.  

## 2014-12-17 NOTE — H&P (Signed)
Reason For Visit right flank pain   History of Present Illness 64M seen today to again discuss treatment options for his 9mm right renal stone. He was seen 10 days ago as an urgent work-in for his right flank pain. At that time his pain was intermittent.  A CT scan was repeated demonstrating a 100mm stone in the renal pelvis. He did have a dilated left renal pelvis with some stranding - evidence that he may be having intermittently obstructing UPJ stone. The patient states that his pain is intermittent, currently is not having any symptoms. He is at this point ready to take care of the stone, although he does have some important. She has coming up which he would like to schedule around. He denies any fevers or chills. He did state that 7 months ago had an episode of gross hematuria which was associated with heavy lifting and dehydration. He has not had any since. The patient denies any nausea or vomiting currently.  The patient recently had gallbladder surgery which he tolerated well. There are no additional updates to the patient's past medical history or family history.   Past Medical History Problems  1. History of Asthma (J45.909) 2. History of depression (Z86.59) 3. History of esophageal reflux (Z87.19) 4. History of hypercholesterolemia (Z86.39)  Surgical History Problems  1. History of Appendectomy 2. History of Cholecystectomy  Current Meds 1. Dexilant 60 MG Oral Capsule Delayed Release;  Therapy: (Recorded:05Aug2016) to Recorded 2. Enbrel 50 MG/ML SOLN;  Therapy: (Recorded:05Aug2016) to Recorded  Allergies Medication  1. Contrast Media Ready-Box MISC Non-Medication  2. Contrast Dye  Family History Problems  1. Family history of Blood In Urine 2. Family history of Death In The Family Father   doesn't  know cause 3. Family history of Death In The Family Mother   stroke 4. Family history of Family Health Status Number Of Children   2 sons, one daughter 20. Family  history of Nephrolithiasis : Sister  Social History Problems  1. Activities of daily living (ADL's), independent 2. Alcohol Use   one per month 3. Caffeine Use   drinks 1-2 soda per day 4. Exercise habits   Patient describes minimal to no exercise or activity at this time he is trying to ride a bike     at least 1 mile a day for exercise because his work is very sedentary.  He does takes     walks when he can. 5. Marital History - Currently Married 6. Never A Smoker 7. Occupation:   Tree surgeon 8. Denied: History of Tobacco Use  Review of Systems No changes in pts bowel habits, neurological changes, or progressive lower urinary tract symptoms.    Vitals Vital Signs [Data Includes: Last 1 Day]  Recorded: 16Aug2016 11:01AM  Weight: 185 lb  BMI Calculated: 25.8 BSA Calculated: 2.04 Blood Pressure: 121 / 77 Temperature: 97.2 F Heart Rate: 67  Physical Exam No acute distress  Heart sounds are normal, regular rate and rhythm  Lungs sounds are clear to auscultation bilaterally  No CVA tenderness bilaterally.  Abdomen is soft, nontender nondistended   Results/Data Urine [Data Includes: Last 1 Day]   [id number]  COLOR YELLOW   APPEARANCE CLEAR   SPECIFIC GRAVITY 1.025   pH 6.0   GLUCOSE NEGATIVE   BILIRUBIN NEGATIVE   KETONE NEGATIVE   BLOOD 3+   PROTEIN 1+   NITRITE NEGATIVE   LEUKOCYTE ESTERASE TRACE   SQUAMOUS EPITHELIAL/HPF NONE SEEN HPF  WBC 6-10 WBC/HPF  RBC 20-40 RBC/HPF  BACTERIA NONE SEEN HPF  CRYSTALS NONE SEEN HPF  CASTS NONE SEEN LPF  Other    Yeast NONE SEEN HPF   Patient's urinalysis today demonstrates white cells and red cells. No evidence of infection. I did send a urine culture either way.  I independently reviewed the patient's CT scan from his recent visit here 10 days prior. This demonstrates a 10 mm stone in the lower portion of the right kidney. There is some associated pelvic caliectasis and stranding. The patient has a fairly  shallow lower pole, making the stone closer to the UPJ. This stone is easily visualized on the scalp film.   Assessment Assessed  1. Right flank pain (R10.9)  The patient has a 10 mm stone at the right UPJ which appears to be causing him intermittent obstruction and associated pain. Currently the patient is asymptomatic.   Plan  Health Maintenance  1. UA With REFLEX; [Do Not Release]; Status:Complete;   Done: [id number] 10:41AM Right flank pain  2. Start: Diazepam 5 MG Oral Tablet; TAKE 1 TABLET 4 TIMES DAILY AS NEEDED 3. Follow-up Schedule Surgery Office  Follow-up  Status: Complete  Done: [id number]  URINE CULTURE; Status:In Progress - Specimen/Data Collected;  Done: [id number] Perform:[facility]; Due:18Aug2016; Marked Important; Last Updated QQ:VZDGLOV, Kirtland Bouchard; 12/04/2014 11:41:37 AM;Ordered; Today;  FIE:PPIRJJOAC kidney stones; Ordered ZY:SAYTKZS, Marland Kitchen;   Discussion/Summary We discussed management options including medical expulsion therapy, shockwave lithotripsy, and ureteroscopy. Ultimately, the patient has opted for shock wave lithotripsy. I discussed with the patient the procedure in detail as well as the risk and benefits. The patient is aware that she may need additional procedures. Hhe also is aware of the risks of hematoma and pain. We will try to get this patient's scheduled as soon as possible.

## 2014-12-18 ENCOUNTER — Encounter (HOSPITAL_COMMUNITY): Payer: Self-pay | Admitting: *Deleted

## 2014-12-18 ENCOUNTER — Encounter (HOSPITAL_COMMUNITY)
Admission: AD | Disposition: A | Discharge status: Home / Self Care | Payer: Self-pay | Source: Ambulatory Visit | Attending: Urology

## 2014-12-18 ENCOUNTER — Ambulatory Visit (HOSPITAL_COMMUNITY): Payer: BLUE CROSS/BLUE SHIELD | Admitting: Anesthesiology

## 2014-12-18 ENCOUNTER — Ambulatory Visit (HOSPITAL_COMMUNITY)
Admission: AD | Admit: 2014-12-18 | Discharge: 2014-12-18 | Disposition: A | Discharge status: Home / Self Care | Payer: BLUE CROSS/BLUE SHIELD | Source: Ambulatory Visit | Attending: Urology | Admitting: Urology

## 2014-12-18 ENCOUNTER — Other Ambulatory Visit: Payer: Self-pay | Admitting: Urology

## 2014-12-18 DIAGNOSIS — Z91041 Radiographic dye allergy status: Secondary | ICD-10-CM | POA: Insufficient documentation

## 2014-12-18 DIAGNOSIS — N132 Hydronephrosis with renal and ureteral calculous obstruction: Secondary | ICD-10-CM | POA: Insufficient documentation

## 2014-12-18 DIAGNOSIS — K219 Gastro-esophageal reflux disease without esophagitis: Secondary | ICD-10-CM | POA: Diagnosis not present

## 2014-12-18 DIAGNOSIS — F329 Major depressive disorder, single episode, unspecified: Secondary | ICD-10-CM | POA: Insufficient documentation

## 2014-12-18 DIAGNOSIS — N201 Calculus of ureter: Secondary | ICD-10-CM

## 2014-12-18 DIAGNOSIS — F419 Anxiety disorder, unspecified: Secondary | ICD-10-CM | POA: Diagnosis not present

## 2014-12-18 DIAGNOSIS — J45909 Unspecified asthma, uncomplicated: Secondary | ICD-10-CM | POA: Insufficient documentation

## 2014-12-18 DIAGNOSIS — Z885 Allergy status to narcotic agent status: Secondary | ICD-10-CM | POA: Diagnosis not present

## 2014-12-18 DIAGNOSIS — N400 Enlarged prostate without lower urinary tract symptoms: Secondary | ICD-10-CM | POA: Insufficient documentation

## 2014-12-18 HISTORY — PX: HOLMIUM LASER APPLICATION: SHX5852

## 2014-12-18 HISTORY — PX: CYSTOSCOPY WITH RETROGRADE PYELOGRAM, URETEROSCOPY AND STENT PLACEMENT: SHX5789

## 2014-12-18 SURGERY — CYSTOURETEROSCOPY, WITH RETROGRADE PYELOGRAM AND STENT INSERTION
Anesthesia: General | Site: Ureter | Laterality: Right

## 2014-12-18 MED ORDER — PROPOFOL 10 MG/ML IV BOLUS
INTRAVENOUS | Status: DC | PRN
  Administered 2014-12-18: 150 mg via INTRAVENOUS

## 2014-12-18 MED ORDER — OXYCODONE HCL 5 MG PO TABS: 5.0000 mg | ORAL_TABLET | Freq: Once | ORAL | Status: DC | PRN

## 2014-12-18 MED ORDER — PROPOFOL 10 MG/ML IV BOLUS
INTRAVENOUS | Status: AC
  Filled 2014-12-18: qty 20

## 2014-12-18 MED ORDER — SODIUM CHLORIDE 0.45 % IV SOLN
INTRAVENOUS | Status: DC
  Administered 2014-12-18: 16:00:00 via INTRAVENOUS

## 2014-12-18 MED ORDER — CIPROFLOXACIN IN D5W 400 MG/200ML IV SOLN
INTRAVENOUS | Status: AC
Start: 2014-12-18 — End: 2014-12-18
  Filled 2014-12-18: qty 200

## 2014-12-18 MED ORDER — OXYCODONE-ACETAMINOPHEN 5-325 MG PO TABS: 1.0000 | ORAL_TABLET | ORAL | Status: AC | PRN

## 2014-12-18 MED ORDER — FENTANYL CITRATE (PF) 100 MCG/2ML IJ SOLN: 25.0000 ug | INTRAMUSCULAR | Status: DC | PRN

## 2014-12-18 MED ORDER — SODIUM CHLORIDE 0.9 % IV SOLN
10000.0000 ug | INTRAVENOUS | Status: DC | PRN
  Administered 2014-12-18 (×2): 40 ug via INTRAVENOUS

## 2014-12-18 MED ORDER — IOHEXOL 300 MG/ML  SOLN
INTRAMUSCULAR | Status: DC | PRN
  Administered 2014-12-18: 10 mL via URETHRAL

## 2014-12-18 MED ORDER — BELLADONNA ALKALOIDS-OPIUM 16.2-60 MG RE SUPP
RECTAL | Status: AC
  Filled 2014-12-18: qty 1

## 2014-12-18 MED ORDER — KETOROLAC TROMETHAMINE 30 MG/ML IJ SOLN: 30.0000 mg | Freq: Once | INTRAMUSCULAR | Status: DC

## 2014-12-18 MED ORDER — PROMETHAZINE HCL 25 MG/ML IJ SOLN: 6.2500 mg | INTRAMUSCULAR | Status: DC | PRN

## 2014-12-18 MED ORDER — CIPROFLOXACIN IN D5W 400 MG/200ML IV SOLN
400.0000 mg | INTRAVENOUS | Status: AC
  Administered 2014-12-18: 400 mg via INTRAVENOUS

## 2014-12-18 MED ORDER — SODIUM CHLORIDE 0.9 % IR SOLN
Status: DC | PRN
  Administered 2014-12-18: 1000 mL
  Administered 2014-12-18: 3000 mL

## 2014-12-18 MED ORDER — MIDAZOLAM HCL 5 MG/5ML IJ SOLN
INTRAMUSCULAR | Status: DC | PRN
  Administered 2014-12-18: 2 mg via INTRAVENOUS

## 2014-12-18 MED ORDER — FENTANYL CITRATE (PF) 100 MCG/2ML IJ SOLN
INTRAMUSCULAR | Status: DC | PRN
  Administered 2014-12-18 (×2): 50 ug via INTRAVENOUS

## 2014-12-18 MED ORDER — MEPERIDINE HCL 50 MG/ML IJ SOLN
6.2500 mg | INTRAMUSCULAR | Status: DC | PRN
  Administered 2014-12-18: 12.5 mg via INTRAVENOUS

## 2014-12-18 MED ORDER — LIDOCAINE HCL (CARDIAC) 20 MG/ML IV SOLN
INTRAVENOUS | Status: AC
  Filled 2014-12-18: qty 5

## 2014-12-18 MED ORDER — PHENAZOPYRIDINE HCL 200 MG PO TABS: 200.0000 mg | ORAL_TABLET | Freq: Three times a day (TID) | ORAL | Status: AC | PRN

## 2014-12-18 MED ORDER — BELLADONNA ALKALOIDS-OPIUM 16.2-60 MG RE SUPP
RECTAL | Status: DC | PRN
  Administered 2014-12-18: 1 via RECTAL

## 2014-12-18 MED ORDER — HYDROMORPHONE HCL 1 MG/ML IJ SOLN: 1.0000 mg | INTRAMUSCULAR | Status: DC | PRN

## 2014-12-18 MED ORDER — LIDOCAINE HCL 2 % EX GEL
CUTANEOUS | Status: AC
Start: 2014-12-18 — End: 2014-12-18
  Filled 2014-12-18: qty 10

## 2014-12-18 MED ORDER — ONDANSETRON HCL 4 MG/2ML IJ SOLN
INTRAMUSCULAR | Status: DC | PRN
Start: 2014-12-18 — End: 2014-12-18
  Administered 2014-12-18: 4 mg via INTRAVENOUS

## 2014-12-18 MED ORDER — MEPERIDINE HCL 50 MG/ML IJ SOLN
INTRAMUSCULAR | Status: AC
  Filled 2014-12-18: qty 1

## 2014-12-18 MED ORDER — ONDANSETRON HCL 4 MG/2ML IJ SOLN
INTRAMUSCULAR | Status: AC
  Filled 2014-12-18: qty 2

## 2014-12-18 MED ORDER — FENTANYL CITRATE (PF) 100 MCG/2ML IJ SOLN
INTRAMUSCULAR | Status: AC
  Filled 2014-12-18: qty 4

## 2014-12-18 MED ORDER — OXYCODONE HCL 5 MG/5ML PO SOLN
5.0000 mg | Freq: Once | ORAL | Status: DC | PRN
  Filled 2014-12-18: qty 5

## 2014-12-18 MED ORDER — PHENYLEPHRINE 40 MCG/ML (10ML) SYRINGE FOR IV PUSH (FOR BLOOD PRESSURE SUPPORT)
PREFILLED_SYRINGE | INTRAVENOUS | Status: AC
  Filled 2014-12-18: qty 10

## 2014-12-18 MED ORDER — LIDOCAINE HCL (CARDIAC) 20 MG/ML IV SOLN
INTRAVENOUS | Status: DC | PRN
  Administered 2014-12-18: 50 mg via INTRAVENOUS

## 2014-12-18 MED ORDER — LIDOCAINE HCL 2 % EX GEL
CUTANEOUS | Status: DC | PRN
  Administered 2014-12-18: 1 via URETHRAL

## 2014-12-18 MED ORDER — MIDAZOLAM HCL 2 MG/2ML IJ SOLN
INTRAMUSCULAR | Status: AC
  Filled 2014-12-18: qty 4

## 2014-12-18 MED ORDER — LACTATED RINGERS IV SOLN
INTRAVENOUS | Status: DC | PRN
  Administered 2014-12-18: 17:00:00 via INTRAVENOUS

## 2014-12-18 SURGICAL SUPPLY — 20 items
BAG URO CATCHER STRL LF (DRAPE) ×2 IMPLANT
BASKET ZERO TIP NITINOL 2.4FR (BASKET) ×1 IMPLANT
BSKT STON RTRVL ZERO TP 2.4FR (BASKET) ×1
CATH INTERMIT  6FR 70CM (CATHETERS) ×2 IMPLANT
CLOTH BEACON ORANGE TIMEOUT ST (SAFETY) ×2 IMPLANT
FIBER LASER FLEXIVA 1000 (UROLOGICAL SUPPLIES) IMPLANT
FIBER LASER FLEXIVA 200 (UROLOGICAL SUPPLIES) IMPLANT
FIBER LASER FLEXIVA 365 (UROLOGICAL SUPPLIES) ×1 IMPLANT
FIBER LASER FLEXIVA 550 (UROLOGICAL SUPPLIES) IMPLANT
FIBER LASER TRAC TIP (UROLOGICAL SUPPLIES) IMPLANT
GLOVE BIOGEL M STRL SZ7.5 (GLOVE) ×2 IMPLANT
GOWN STRL REUS W/TWL XL LVL3 (GOWN DISPOSABLE) ×2 IMPLANT
GUIDEWIRE STR DUAL SENSOR (WIRE) ×2 IMPLANT
MANIFOLD NEPTUNE II (INSTRUMENTS) ×2 IMPLANT
NS IRRIG 1000ML POUR BTL (IV SOLUTION) ×2 IMPLANT
PACK CYSTO (CUSTOM PROCEDURE TRAY) ×2 IMPLANT
SCRUB PCMX 4 OZ (MISCELLANEOUS) ×2 IMPLANT
STENT URET 6FRX24 CONTOUR (STENTS) ×1 IMPLANT
SYRINGE IRR TOOMEY STRL 70CC (SYRINGE) ×1 IMPLANT
TUBING CONNECTING 10 (TUBING) ×2 IMPLANT

## 2014-12-18 NOTE — H&P (Signed)
Active Problems Problems  1. Calculus of right ureter (N20.1)   Assessed By: Jimmey Ralph (Urology); Last Assessed: 18 Dec 2014 2. Hydronephrosis, right (N13.30)   Assessed By: Jimmey Ralph (Urology); Last Assessed: 18 Dec 2014 3. Microscopic hematuria (R31.2)   Assessed By: Jimmey Ralph (Urology); Last Assessed: 18 Dec 2014 4. Right flank pain (R10.9)   Assessed By: Jimmey Ralph (Urology); Last Assessed: 18 Dec 2014  History of Present Illness 59 YO male patient of Dr. Carlton Adam seen today as a work-in for flank pain post (R) UPJ ESWL 12/17/14.     Allergy: IVP CONTRAST DYE ( HIVES)              Oxycodone ( itching)  Interval Hx:   For past 16 hrs now with pain not controlled with PRN Oxycodone or Ibuprofen. Has passed a few sand like particles. Denies n/v, f/c, or hematuria.   PMH: 1. Asthma                   2. Depression           3. GERD           4. Elevated cholesterol.  Meds:  1. Dexilant 60mg  2. Enbrel, 50mg /ml 3. Diazepam 5mg  qid  Vitals Vital Signs [Data Includes: Last 1 Day]  Recorded: 30Aug2016 10:33AM  Height: 5 ft 11 in Weight: 185 lb  BMI Calculated: 25.8 BSA Calculated: 2.04 Blood Pressure: 119 / 76, Sitting Temperature: 98.4 F, Oral Heart Rate: 82 Respiration: 14  Physical Exam Constitutional: Well nourished and well developed . No acute distress. The patient appears well hydrated.  Abdomen: The abdomen is flat. Moderate tenderness in the RLQ is present, but no LLQ tenderness. moderate right CVA tenderness and no left CVA tenderness.    Results/Data Urine [Data Includes: Last 1 Day]   30Aug2016  COLOR YELLOW   APPEARANCE CLEAR   SPECIFIC GRAVITY 1.020   pH 6.0   GLUCOSE NEGATIVE   BILIRUBIN NEGATIVE   KETONE NEGATIVE   BLOOD 2+   PROTEIN NEGATIVE   NITRITE NEGATIVE   LEUKOCYTE ESTERASE NEGATIVE   SQUAMOUS EPITHELIAL/HPF NONE SEEN HPF  WBC NONE SEEN WBC/HPF  RBC 3-10 RBC/HPF  BACTERIA NONE SEEN HPF  CRYSTALS NONE SEEN HPF   CASTS NONE SEEN LPF  Yeast NONE SEEN HPF   The following images/tracing/specimen were independently visualized:  KUB: shows large right renal stone has fragmented. Has 1 large fragment at right lower pole. 1 large fragment at right UPJ and large Steinstrasse at distal right UVJ. RUS limited right: 14.10 X 1.16 X 5.4 X 4.94 cm. Perinephric fluid. Moderate hydronephrosis. Lower pole stone: 1.44 cm. UPJ stone: 1.77 cm.  The following clinical lab reports were reviewed:  UA: microscopic hematuria.  PVR: Ultrasound PVR 20.5 ml.    Assessment Assessed  1. Right flank pain (R10.9) 2. Microscopic hematuria (R31.2) 3. Calculus of right ureter (N20.1) 4. Hydronephrosis, right (N13.30)  Plan  Health Maintenance  1. UA With REFLEX; [Do Not Release]; Status:Complete;   Done: [id number] 10:22AM Right flank pain  2. Administer: Ketorolac Tromethamine 30 MG/ML Injection Solution; inject 1 ml; To Be  Done: 30Aug2016 3. Administer: Ketorolac Tromethamine 60 MG/2ML Injection Solution; INJECT 60  MG  Intramuscular 4. Administered: Ketorolac Tromethamine 30 MG/ML Injection Solution  Ketorolac 30 mg IM today   Discussed with both Dr. Gaynelle Arabian (on-call) and Dr. Louis Meckel. Both agree that pt would probably benefit from urgent cystoscopy/stent placement today. Risks and benefits discussed with both  pt and wife which include: general anesthesia, infection, hematuria, or possible injury to bladder or ureter. All questions addressed and answered and pt wishes to proceed.     KUB; Status:Resulted - Requires Verification;  Done: [id number] 12:00AM  Due:01Sep2016; Marked Important;Ordered; Today;   YTS:SQSYP flank pain; Ordered ZX:AQWBEQ, Diane;   RENAL U/S RIGHT; Status:Resulted - Requires Verification;  Done: 85KIS3014 12:00AM  Due:01Sep2016; Marked Important;Ordered; Today;   XPF:RHZJG flank pain; Ordered JG:MLVXBO, Diane;   Electronics engineer signed by : Jimmey Ralph, Trey Paula; Dec 18 2014  12:17PM EST

## 2014-12-18 NOTE — Anesthesia Procedure Notes (Signed)
Procedure Name: LMA Insertion Performed by: Maryana Pittmon M Pre-anesthesia Checklist: Patient identified, Emergency Drugs available, Suction available, Patient being monitored and Timeout performed Patient Re-evaluated:Patient Re-evaluated prior to inductionOxygen Delivery Method: Circle system utilized Preoxygenation: Pre-oxygenation with 100% oxygen Intubation Type: IV induction Ventilation: Mask ventilation without difficulty LMA: LMA inserted LMA Size: 4.0 Number of attempts: 1 Placement Confirmation: positive ETCO2 and breath sounds checked- equal and bilateral Tube secured with: Tape     

## 2014-12-18 NOTE — Anesthesia Preprocedure Evaluation (Addendum)
Anesthesia Evaluation  Patient identified by MRN, date of birth, ID band Patient awake    Reviewed: Allergy & Precautions, NPO status , Patient's Chart, lab work & pertinent test results  Airway Mallampati: I  TM Distance: >3 FB Neck ROM: Full    Dental  (+) Teeth Intact, Dental Advisory Given   Pulmonary asthma ,  breath sounds clear to auscultation        Cardiovascular negative cardio ROS  Rhythm:Regular Rate:Normal     Neuro/Psych Anxiety Depression negative neurological ROS     GI/Hepatic Neg liver ROS, GERD-  ,  Endo/Other  negative endocrine ROS  Renal/GU Renal disease     Musculoskeletal   Abdominal   Peds  Hematology negative hematology ROS (+)   Anesthesia Other Findings   Reproductive/Obstetrics                            Anesthesia Physical Anesthesia Plan  ASA: II  Anesthesia Plan: General   Post-op Pain Management:    Induction: Intravenous  Airway Management Planned: LMA  Additional Equipment:   Intra-op Plan:   Post-operative Plan:   Informed Consent: I have reviewed the patients History and Physical, chart, labs and discussed the procedure including the risks, benefits and alternatives for the proposed anesthesia with the patient or authorized representative who has indicated his/her understanding and acceptance.   Dental advisory given  Plan Discussed with: CRNA  Anesthesia Plan Comments:         Anesthesia Quick Evaluation

## 2014-12-18 NOTE — Op Note (Signed)
Pre-operative diagnosis :  Steinstrasse, with impacted distal  64mm stone fragment in Right ureter  post  Lithotripsy of Right Lower Pole stone  Postoperative diagnosis: Same  Operation:  Cystourethroscopy, right retrograde pyelogram with interpretation, right ureteroscopy, laser fragmentation of impacted distal right ureteral stone fragment, basket extraction of stone fragments, right double-J stent placement (6 French by 24 cm double-J stent with suture intact)  Surgeon:  S. Gaynelle Arabian, MD  First assistant:  None  Anesthesia:  Gen. LMA  Preparation:  After appropriate preanesthesia, the patient was brought the operating room, placed on the operating table in the dorsal supine position where general LMA anesthesia was introduced. The armband was double checked. The patient was previously marked in the arm, and the right flank. X-rays were reviewed.  Review history: The patient is 24 hours post lithotripsy of right lower pole stone. He has had intense nausea vomiting and flank pain today. KUB shows stone material wedged in the lower ureter, and the patient is now for basket extraction and double-J stent.  Statement of  Likelihood of Success: Excellent. TIME-OUT observed.:  Procedure:  Cystourethroscopy reveals bilobar BPH. The bladder neck is normal. The bladder base is normal and the trigone appears normal. There is no evidence of trabeculation, cellule formation, bladder stone, or bladder tumor.  The right ureteral orifice is identified. Rectal A Polygram reveals stone in the lower ureter, and stone material in the mid ureter. A guidewire was passed around the stone, and coiled in the renal pelvis. The 6.5 [place] dual channel Wolf ureteroscope was then passed beside the guidewire, into the ureter. A large 5 mm oblong fragment is identified. Fragment would not be removed with a 0 tip basket. Therefore, a laser fiber was passed down the opposite side channel, and with settings of 0.2, and 10, the  stone was fragmented, and pieces removed. Repeat ureteroscopy was a copper's, showing only tiny fragments of stone, and tiny amounts of blood clot. I double-J stent was easily passed into the renal pelvis, and coiled in the pelvis, and in the bladder. A suture was left on the end of the double-J catheter. It was taped to the penis. Xylocaine jelly was then placed in the urethra. The patient was awakened and taken to recovery room in good condition.

## 2014-12-18 NOTE — Interval H&P Note (Signed)
History and Physical Interval Note:  12/18/2014 3:15 PM  Scott Dominguez  has presented today for surgery, with the diagnosis of rigt distal stone  The various methods of treatment have been discussed with the patient and family. After consideration of risks, benefits and other options for treatment, the patient has consented to  Procedure(s): CYSTOSCOPY WITH RETROGRADE PYELOGRAM, URETEROSCOPY AND EXTRACTION STONE BASKE AND RIGHT STENT PLACEMENT (Right) HOLMIUM LASER APPLICATION (Right) as a surgical intervention .  The patient's history has been reviewed, patient examined, no change in status, stable for surgery.  I have reviewed the patient's chart and labs.  Questions were answered to the patient's satisfaction.     Maille Halliwell I Marianny Goris  Pt seen and KUB evaluated with Drl. Herrick. Pt will need cysto, JJ stent in order to pass Steinstrasse.

## 2014-12-18 NOTE — Transfer of Care (Signed)
Immediate Anesthesia Transfer of Care Note  Patient: Scott Dominguez  Procedure(s) Performed: Procedure(s): CYSTOSCOPY WITH RETROGRADE PYELOGRAM, URETEROSCOPY AND EXTRACTION STONE BASKE AND RIGHT STENT PLACEMENT (Right) HOLMIUM LASER APPLICATION (Right)  Patient Location: PACU  Anesthesia Type:General  Level of Consciousness:  sedated, patient cooperative and responds to stimulation  Airway & Oxygen Therapy:Patient Spontanous Breathing and Patient connected to face mask oxgen  Post-op Assessment:  Report given to PACU RN and Post -op Vital signs reviewed and stable  Post vital signs:  Reviewed and stable  Last Vitals:  Filed Vitals:   12/18/14 1357  BP: 130/78  Pulse: 85  Temp: 36.8 C  Resp: 16    Complications: No apparent anesthesia complications

## 2014-12-18 NOTE — Discharge Instructions (Signed)
Dietary Guidelines to Help Prevent Kidney Stones  Your risk of kidney stones can be decreased by adjusting the foods you eat. The most important thing you can do is drink enough fluid. You should drink enough fluid to keep your urine clear or pale yellow. The following guidelines provide specific information for the type of kidney stone you have had.  GUIDELINES ACCORDING TO TYPE OF KIDNEY STONE  Calcium Oxalate Kidney Stones  · Reduce the amount of salt you eat. Foods that have a lot of salt cause your body to release excess calcium into your urine. The excess calcium can combine with a substance called oxalate to form kidney stones.  · Reduce the amount of animal protein you eat if the amount you eat is excessive. Animal protein causes your body to release excess calcium into your urine. Ask your dietitian how much protein from animal sources you should be eating.  · Avoid foods that are high in oxalates. If you take vitamins, they should have less than 500 mg of vitamin C. Your body turns vitamin C into oxalates. You do not need to avoid fruits and vegetables high in vitamin C.  Calcium Phosphate Kidney Stones  · Reduce the amount of salt you eat to help prevent the release of excess calcium into your urine.  · Reduce the amount of animal protein you eat if the amount you eat is excessive. Animal protein causes your body to release excess calcium into your urine. Ask your dietitian how much protein from animal sources you should be eating.  · Get enough calcium from food or take a calcium supplement (ask your dietitian for recommendations). Food sources of calcium that do not increase your risk of kidney stones include:  ¨ Broccoli.  ¨ Dairy products, such as cheese and yogurt.  ¨ Pudding.  Uric Acid Kidney Stones  · Do not have more than 6 oz of animal protein per day.  FOOD SOURCES  Animal Protein Sources  · Meat (all types).  · Poultry.  · Eggs.  · Fish, seafood.  Foods High in Salt  · Salt seasonings.  · Soy  sauce.  · Teriyaki sauce.  · Cured and processed meats.  · Salted crackers and snack foods.  · Fast food.  · Canned soups and most canned foods.  Foods High in Oxalates  · Grains:  ¨ Amaranth.  ¨ Barley.  ¨ Grits.  ¨ Wheat germ.  ¨ Bran.  ¨ Buckwheat flour.  ¨ All bran cereals.  ¨ Pretzels.  ¨ Whole wheat bread.  · Vegetables:  ¨ Beans (wax).  ¨ Beets and beet greens.  ¨ Collard greens.  ¨ Eggplant.  ¨ Escarole.  ¨ Leeks.  ¨ Okra.  ¨ Parsley.  ¨ Rutabagas.  ¨ Spinach.  ¨ Swiss chard.  ¨ Tomato paste.  ¨ Fried potatoes.  ¨ Sweet potatoes.  · Fruits:  ¨ Red currants.  ¨ Figs.  ¨ Kiwi.  ¨ Rhubarb.  · Meat and Other Protein Sources:  ¨ Beans (dried).  ¨ Soy burgers and other soybean products.  ¨ Miso.  ¨ Nuts (peanuts, almonds, pecans, cashews, hazelnuts).  ¨ Nut butters.  ¨ Sesame seeds and tahini (paste made of sesame seeds).  ¨ Poppy seeds.  · Beverages:  ¨ Chocolate drink mixes.  ¨ Soy milk.  ¨ Instant iced tea.  ¨ Juices made from high-oxalate fruits or vegetables.  · Other:  ¨ Carob.  ¨ Chocolate.  ¨ Fruitcake.  ¨ Marmalades.  Document Released:   08/01/2010 Document Revised: 04/11/2013 Document Reviewed: 03/03/2013  Exit[employer]® Patient Information ©2015 [employer]. This information is not intended to replace advice given to you by your health care provider. Make sure you discuss any questions you have with your health care provider.

## 2014-12-18 NOTE — Anesthesia Postprocedure Evaluation (Signed)
  Anesthesia Post-op Note  Patient: Scott Dominguez  Procedure(s) Performed: Procedure(s): CYSTOSCOPY WITH RETROGRADE PYELOGRAM, URETEROSCOPY AND EXTRACTION STONE BASKE AND RIGHT STENT PLACEMENT (Right) HOLMIUM LASER APPLICATION (Right)  Patient Location: PACU  Anesthesia Type:General  Level of Consciousness: awake and alert   Airway and Oxygen Therapy: Patient Spontanous Breathing  Post-op Pain: none  Post-op Assessment: Post-op Vital signs reviewed              Post-op Vital Signs: Reviewed  Last Vitals:  Filed Vitals:   12/18/14 1908  BP: 132/73  Pulse: 97  Temp: 36.9 C  Resp: 16    Complications: No apparent anesthesia complications

## 2014-12-19 ENCOUNTER — Encounter (HOSPITAL_COMMUNITY): Payer: Self-pay | Admitting: Urology

## 2015-01-17 ENCOUNTER — Encounter: Payer: Self-pay | Admitting: Podiatry

## 2015-01-17 ENCOUNTER — Ambulatory Visit (INDEPENDENT_AMBULATORY_CARE_PROVIDER_SITE_OTHER): Payer: BLUE CROSS/BLUE SHIELD | Admitting: Podiatry

## 2015-01-17 ENCOUNTER — Telehealth: Payer: Self-pay | Admitting: *Deleted

## 2015-01-17 VITALS — BP 127/82 | HR 72 | Resp 12

## 2015-01-17 DIAGNOSIS — G5750 Tarsal tunnel syndrome, unspecified lower limb: Secondary | ICD-10-CM | POA: Diagnosis not present

## 2015-01-17 NOTE — Telephone Encounter (Signed)
Faxed orderes to [place].

## 2015-01-17 NOTE — Progress Notes (Signed)
He presents today for follow-up of his pain to the plantar aspect of the foot and the distal forefoot. 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  He states that the Medrol Dosepak and the meloxicam did absolutely nothing to alleviate his symptoms.  Objective : vital signs are stable he is alert and oriented 3. Pulses are palpable. Positive Tinel sign right over left.   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  Plan: we're requesting that he be evaluated for nerve conduction velocity exam with [facility] neurology.

## 2015-07-25 DIAGNOSIS — R3129 Other microscopic hematuria: Secondary | ICD-10-CM | POA: Diagnosis not present

## 2015-07-25 DIAGNOSIS — N200 Calculus of kidney: Secondary | ICD-10-CM | POA: Diagnosis not present

## 2015-07-25 DIAGNOSIS — Z0000 Encounter for general adult medical examination without abnormal findings: Secondary | ICD-10-CM | POA: Diagnosis not present

## 2015-07-30 DIAGNOSIS — N200 Calculus of kidney: Secondary | ICD-10-CM | POA: Diagnosis not present

## 2015-07-30 DIAGNOSIS — M545 Low back pain: Secondary | ICD-10-CM | POA: Diagnosis not present

## 2015-07-30 DIAGNOSIS — Z0000 Encounter for general adult medical examination without abnormal findings: Secondary | ICD-10-CM | POA: Diagnosis not present

## 2015-08-08 DIAGNOSIS — R42 Dizziness and giddiness: Secondary | ICD-10-CM | POA: Diagnosis not present

## 2015-08-16 DIAGNOSIS — J320 Chronic maxillary sinusitis: Secondary | ICD-10-CM | POA: Diagnosis not present

## 2015-08-16 DIAGNOSIS — H6063 Unspecified chronic otitis externa, bilateral: Secondary | ICD-10-CM | POA: Diagnosis not present

## 2015-08-16 DIAGNOSIS — H7291 Unspecified perforation of tympanic membrane, right ear: Secondary | ICD-10-CM | POA: Diagnosis not present

## 2015-08-21 DIAGNOSIS — J320 Chronic maxillary sinusitis: Secondary | ICD-10-CM | POA: Diagnosis not present

## 2015-08-21 DIAGNOSIS — H9041 Sensorineural hearing loss, unilateral, right ear, with unrestricted hearing on the contralateral side: Secondary | ICD-10-CM | POA: Diagnosis not present

## 2015-08-21 DIAGNOSIS — H8141 Vertigo of central origin, right ear: Secondary | ICD-10-CM | POA: Diagnosis not present

## 2015-08-29 DIAGNOSIS — F419 Anxiety disorder, unspecified: Secondary | ICD-10-CM | POA: Diagnosis not present

## 2015-08-29 DIAGNOSIS — R251 Tremor, unspecified: Secondary | ICD-10-CM | POA: Diagnosis not present

## 2015-08-29 DIAGNOSIS — H9311 Tinnitus, right ear: Secondary | ICD-10-CM | POA: Diagnosis not present

## 2015-08-29 DIAGNOSIS — R42 Dizziness and giddiness: Secondary | ICD-10-CM | POA: Diagnosis not present

## 2015-08-29 DIAGNOSIS — H8141 Vertigo of central origin, right ear: Secondary | ICD-10-CM | POA: Diagnosis not present

## 2015-08-29 DIAGNOSIS — H903 Sensorineural hearing loss, bilateral: Secondary | ICD-10-CM | POA: Diagnosis not present

## 2015-11-01 DIAGNOSIS — Z125 Encounter for screening for malignant neoplasm of prostate: Secondary | ICD-10-CM | POA: Diagnosis not present

## 2015-11-01 DIAGNOSIS — Z0000 Encounter for general adult medical examination without abnormal findings: Secondary | ICD-10-CM | POA: Diagnosis not present

## 2015-11-01 DIAGNOSIS — R251 Tremor, unspecified: Secondary | ICD-10-CM | POA: Diagnosis not present

## 2015-11-01 DIAGNOSIS — E559 Vitamin D deficiency, unspecified: Secondary | ICD-10-CM | POA: Diagnosis not present

## 2015-11-01 DIAGNOSIS — Z23 Encounter for immunization: Secondary | ICD-10-CM | POA: Diagnosis not present

## 2015-11-01 DIAGNOSIS — E7800 Pure hypercholesterolemia, unspecified: Secondary | ICD-10-CM | POA: Diagnosis not present

## 2015-11-22 DIAGNOSIS — W57XXXA Bitten or stung by nonvenomous insect and other nonvenomous arthropods, initial encounter: Secondary | ICD-10-CM | POA: Diagnosis not present

## 2015-11-22 DIAGNOSIS — S70361A Insect bite (nonvenomous), right thigh, initial encounter: Secondary | ICD-10-CM | POA: Diagnosis not present

## 2015-12-10 DIAGNOSIS — M545 Low back pain: Secondary | ICD-10-CM | POA: Diagnosis not present

## 2015-12-10 DIAGNOSIS — R310 Gross hematuria: Secondary | ICD-10-CM | POA: Diagnosis not present

## 2015-12-10 DIAGNOSIS — N200 Calculus of kidney: Secondary | ICD-10-CM | POA: Diagnosis not present

## 2015-12-12 DIAGNOSIS — M545 Low back pain: Secondary | ICD-10-CM | POA: Diagnosis not present

## 2015-12-12 DIAGNOSIS — R251 Tremor, unspecified: Secondary | ICD-10-CM | POA: Diagnosis not present

## 2015-12-12 DIAGNOSIS — R42 Dizziness and giddiness: Secondary | ICD-10-CM | POA: Diagnosis not present

## 2015-12-12 DIAGNOSIS — F419 Anxiety disorder, unspecified: Secondary | ICD-10-CM | POA: Diagnosis not present

## 2016-01-06 DIAGNOSIS — K219 Gastro-esophageal reflux disease without esophagitis: Secondary | ICD-10-CM | POA: Diagnosis not present

## 2016-01-06 DIAGNOSIS — R1013 Epigastric pain: Secondary | ICD-10-CM | POA: Diagnosis not present

## 2016-01-06 DIAGNOSIS — Z9049 Acquired absence of other specified parts of digestive tract: Secondary | ICD-10-CM | POA: Diagnosis not present

## 2016-01-06 DIAGNOSIS — Z79899 Other long term (current) drug therapy: Secondary | ICD-10-CM | POA: Diagnosis not present

## 2016-01-06 DIAGNOSIS — R197 Diarrhea, unspecified: Secondary | ICD-10-CM | POA: Diagnosis not present

## 2016-01-09 DIAGNOSIS — N200 Calculus of kidney: Secondary | ICD-10-CM | POA: Diagnosis not present

## 2016-01-09 DIAGNOSIS — R3121 Asymptomatic microscopic hematuria: Secondary | ICD-10-CM | POA: Diagnosis not present

## 2016-01-14 DIAGNOSIS — M5432 Sciatica, left side: Secondary | ICD-10-CM | POA: Diagnosis not present

## 2016-01-15 ENCOUNTER — Other Ambulatory Visit: Payer: Self-pay | Admitting: Family Medicine

## 2016-01-15 DIAGNOSIS — M5432 Sciatica, left side: Secondary | ICD-10-CM

## 2016-01-28 ENCOUNTER — Ambulatory Visit
Admission: RE | Admit: 2016-01-28 | Discharge: 2016-01-28 | Disposition: A | Discharge status: Home / Self Care | Payer: BLUE CROSS/BLUE SHIELD | Source: Ambulatory Visit | Attending: Family Medicine | Admitting: Family Medicine

## 2016-01-28 DIAGNOSIS — M5432 Sciatica, left side: Secondary | ICD-10-CM

## 2016-01-28 DIAGNOSIS — M48061 Spinal stenosis, lumbar region without neurogenic claudication: Secondary | ICD-10-CM | POA: Diagnosis not present

## 2016-02-06 DIAGNOSIS — M549 Dorsalgia, unspecified: Secondary | ICD-10-CM | POA: Diagnosis not present

## 2016-02-06 DIAGNOSIS — Z6828 Body mass index (BMI) 28.0-28.9, adult: Secondary | ICD-10-CM | POA: Diagnosis not present

## 2016-02-06 DIAGNOSIS — M4727 Other spondylosis with radiculopathy, lumbosacral region: Secondary | ICD-10-CM | POA: Diagnosis not present

## 2016-02-12 DIAGNOSIS — M545 Low back pain: Secondary | ICD-10-CM | POA: Diagnosis not present

## 2016-02-12 DIAGNOSIS — M6281 Muscle weakness (generalized): Secondary | ICD-10-CM | POA: Diagnosis not present

## 2016-02-14 DIAGNOSIS — M545 Low back pain: Secondary | ICD-10-CM | POA: Diagnosis not present

## 2016-02-14 DIAGNOSIS — M6281 Muscle weakness (generalized): Secondary | ICD-10-CM | POA: Diagnosis not present

## 2016-02-19 DIAGNOSIS — M6281 Muscle weakness (generalized): Secondary | ICD-10-CM | POA: Diagnosis not present

## 2016-02-19 DIAGNOSIS — M545 Low back pain: Secondary | ICD-10-CM | POA: Diagnosis not present

## 2016-02-21 DIAGNOSIS — M6281 Muscle weakness (generalized): Secondary | ICD-10-CM | POA: Diagnosis not present

## 2016-02-21 DIAGNOSIS — M545 Low back pain: Secondary | ICD-10-CM | POA: Diagnosis not present

## 2016-02-26 DIAGNOSIS — M549 Dorsalgia, unspecified: Secondary | ICD-10-CM | POA: Diagnosis not present

## 2016-02-26 DIAGNOSIS — M6281 Muscle weakness (generalized): Secondary | ICD-10-CM | POA: Diagnosis not present

## 2016-02-26 DIAGNOSIS — M545 Low back pain: Secondary | ICD-10-CM | POA: Diagnosis not present

## 2016-02-28 DIAGNOSIS — M6281 Muscle weakness (generalized): Secondary | ICD-10-CM | POA: Diagnosis not present

## 2016-02-28 DIAGNOSIS — M545 Low back pain: Secondary | ICD-10-CM | POA: Diagnosis not present

## 2016-02-28 DIAGNOSIS — L400 Psoriasis vulgaris: Secondary | ICD-10-CM | POA: Diagnosis not present

## 2016-03-10 DIAGNOSIS — M545 Low back pain: Secondary | ICD-10-CM | POA: Diagnosis not present

## 2016-03-10 DIAGNOSIS — M6281 Muscle weakness (generalized): Secondary | ICD-10-CM | POA: Diagnosis not present

## 2016-03-16 DIAGNOSIS — L409 Psoriasis, unspecified: Secondary | ICD-10-CM | POA: Diagnosis not present

## 2016-03-16 DIAGNOSIS — Z79899 Other long term (current) drug therapy: Secondary | ICD-10-CM | POA: Diagnosis not present

## 2016-03-17 DIAGNOSIS — M791 Myalgia: Secondary | ICD-10-CM | POA: Diagnosis not present

## 2016-03-17 DIAGNOSIS — M545 Low back pain: Secondary | ICD-10-CM | POA: Diagnosis not present

## 2016-03-17 DIAGNOSIS — M6281 Muscle weakness (generalized): Secondary | ICD-10-CM | POA: Diagnosis not present

## 2016-03-19 DIAGNOSIS — M545 Low back pain: Secondary | ICD-10-CM | POA: Diagnosis not present

## 2016-03-19 DIAGNOSIS — R251 Tremor, unspecified: Secondary | ICD-10-CM | POA: Diagnosis not present

## 2016-03-20 DIAGNOSIS — M6281 Muscle weakness (generalized): Secondary | ICD-10-CM | POA: Diagnosis not present

## 2016-03-20 DIAGNOSIS — M545 Low back pain: Secondary | ICD-10-CM | POA: Diagnosis not present

## 2016-03-23 DIAGNOSIS — M545 Low back pain: Secondary | ICD-10-CM | POA: Diagnosis not present

## 2016-03-23 DIAGNOSIS — R1030 Lower abdominal pain, unspecified: Secondary | ICD-10-CM | POA: Diagnosis not present

## 2016-03-23 DIAGNOSIS — Z86010 Personal history of colonic polyps: Secondary | ICD-10-CM | POA: Diagnosis not present

## 2016-03-29 DIAGNOSIS — Z23 Encounter for immunization: Secondary | ICD-10-CM | POA: Diagnosis not present

## 2016-04-03 DIAGNOSIS — Z86010 Personal history of colonic polyps: Secondary | ICD-10-CM | POA: Diagnosis not present

## 2016-04-16 DIAGNOSIS — M791 Myalgia: Secondary | ICD-10-CM | POA: Diagnosis not present

## 2016-05-17 DIAGNOSIS — M791 Myalgia: Secondary | ICD-10-CM | POA: Diagnosis not present

## 2016-06-17 DIAGNOSIS — M791 Myalgia: Secondary | ICD-10-CM | POA: Diagnosis not present

## 2016-07-13 DIAGNOSIS — G4700 Insomnia, unspecified: Secondary | ICD-10-CM | POA: Diagnosis not present

## 2016-07-13 DIAGNOSIS — R202 Paresthesia of skin: Secondary | ICD-10-CM | POA: Diagnosis not present

## 2016-07-13 DIAGNOSIS — M48061 Spinal stenosis, lumbar region without neurogenic claudication: Secondary | ICD-10-CM | POA: Diagnosis not present

## 2016-07-22 DIAGNOSIS — R208 Other disturbances of skin sensation: Secondary | ICD-10-CM | POA: Diagnosis not present

## 2016-07-22 DIAGNOSIS — R251 Tremor, unspecified: Secondary | ICD-10-CM | POA: Diagnosis not present

## 2016-07-22 DIAGNOSIS — F419 Anxiety disorder, unspecified: Secondary | ICD-10-CM | POA: Diagnosis not present

## 2016-07-22 DIAGNOSIS — R42 Dizziness and giddiness: Secondary | ICD-10-CM | POA: Diagnosis not present

## 2016-08-28 IMAGING — DX DG ABDOMEN ACUTE W/ 1V CHEST
3 series · 3 of 3 positions shown · non-contrast
Comparison: 07/04/2013

CLINICAL DATA: Abdominal pain for 2 weeks.

EXAM:
ACUTE ABDOMEN SERIES (ABDOMEN 2 VIEW & CHEST 1 VIEW)

[chest pa]
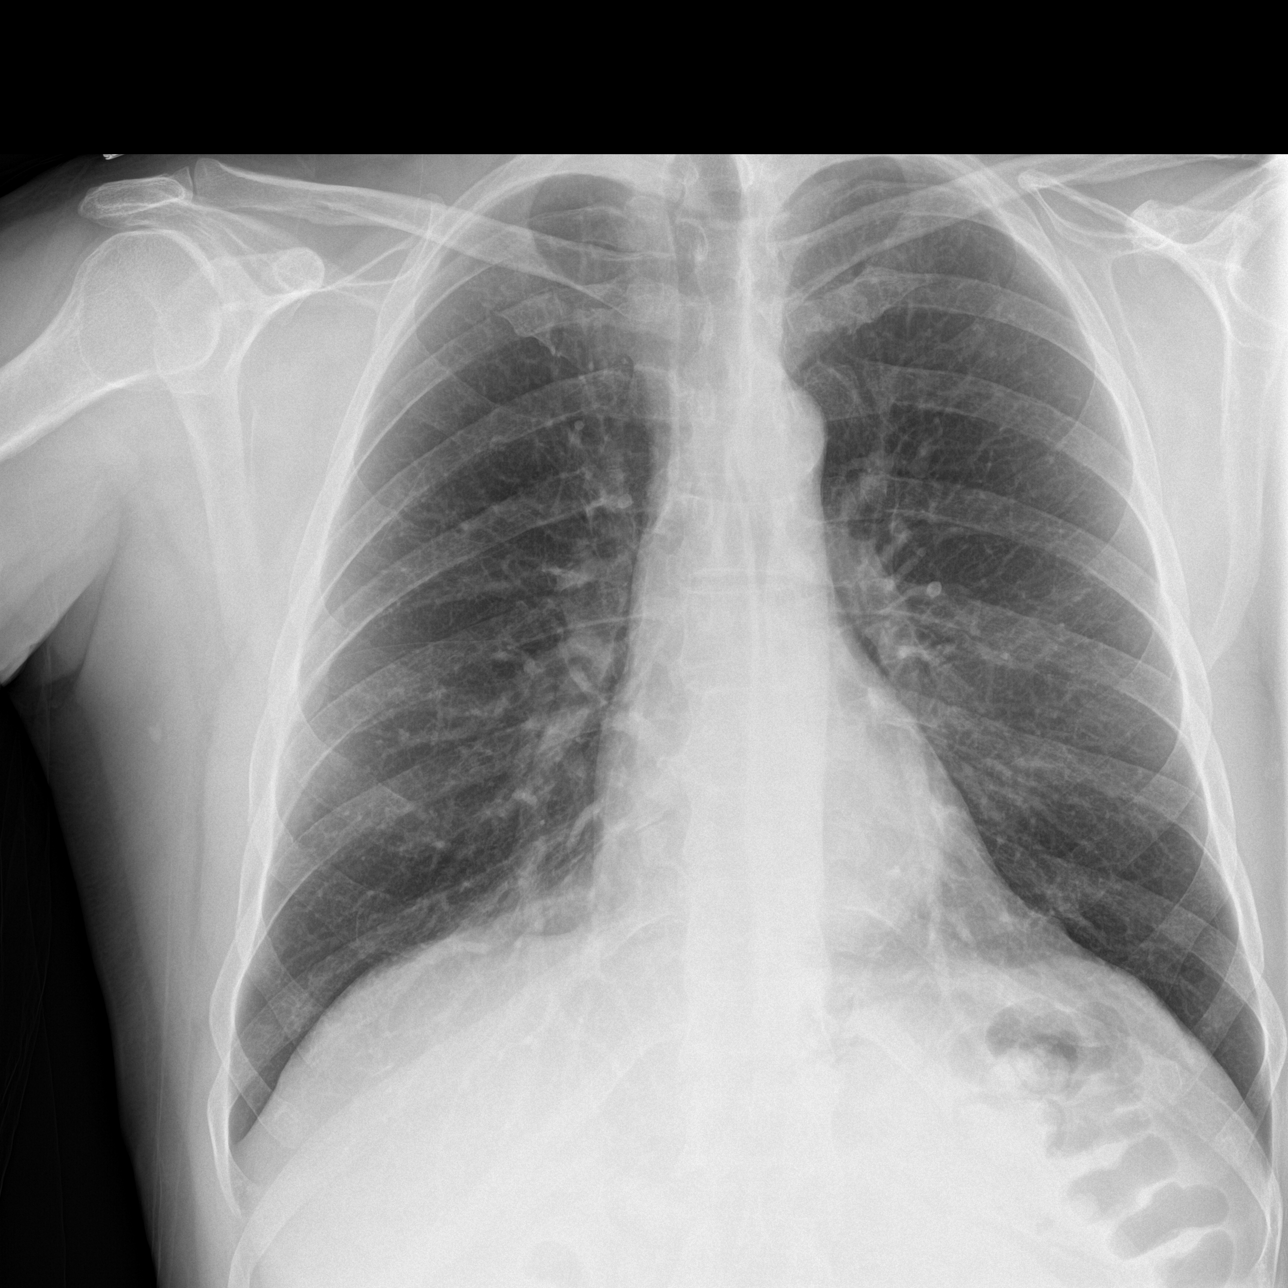

[abdomen erect]
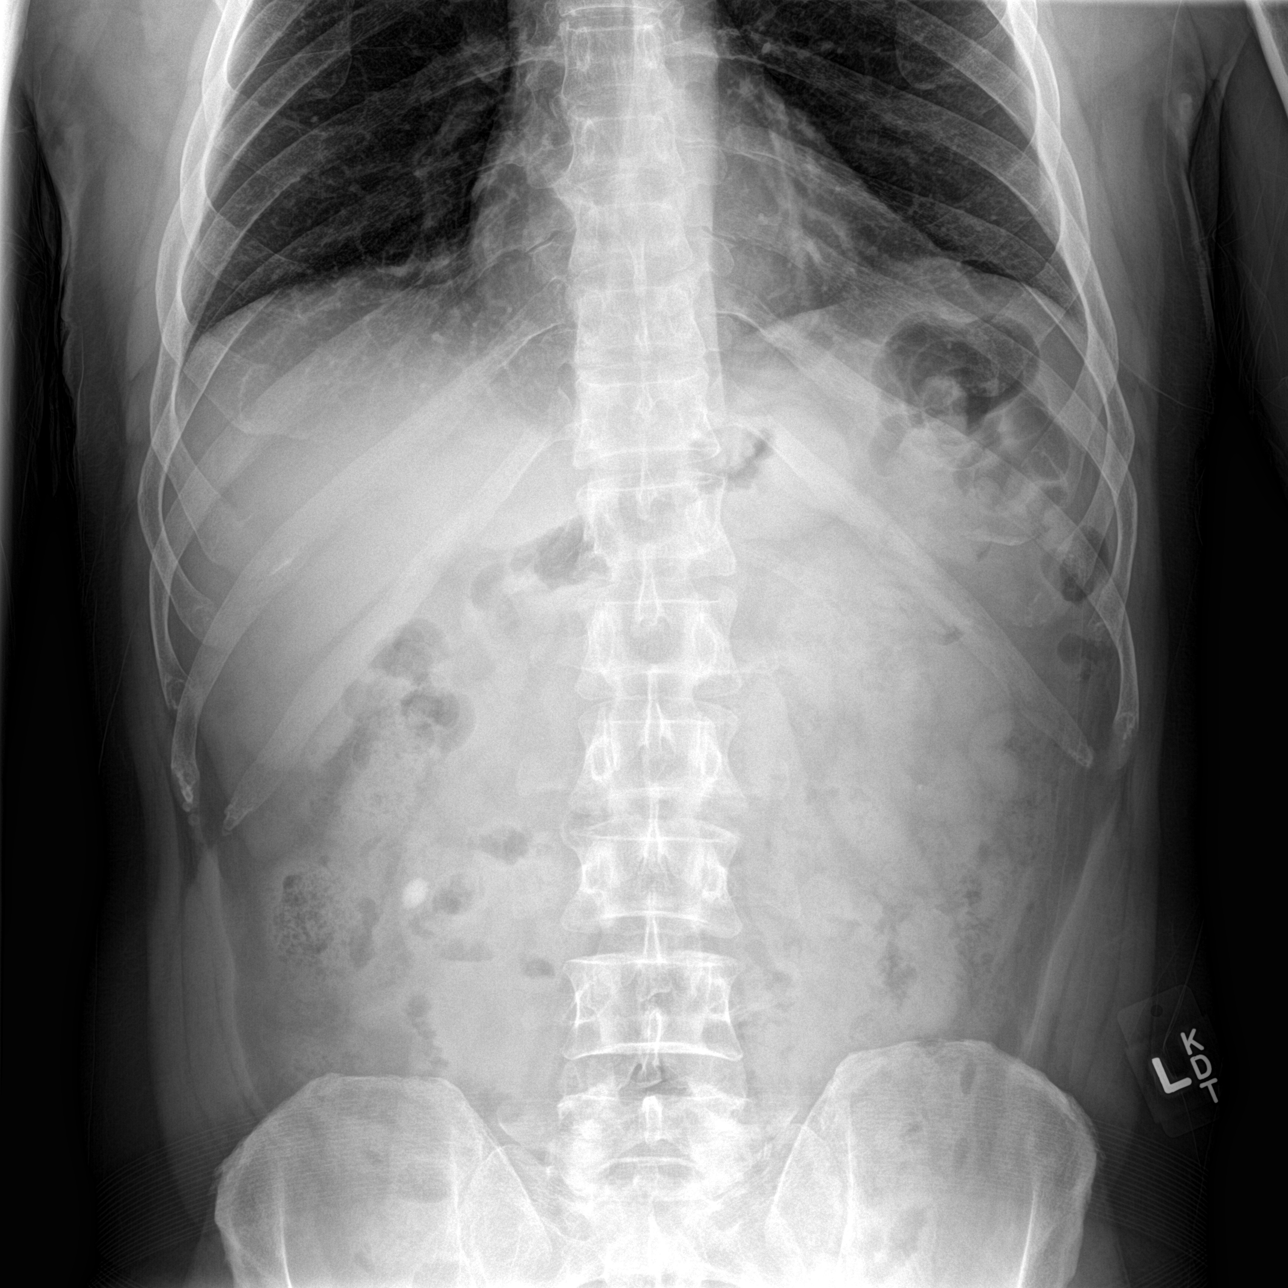

[abdomen supine]
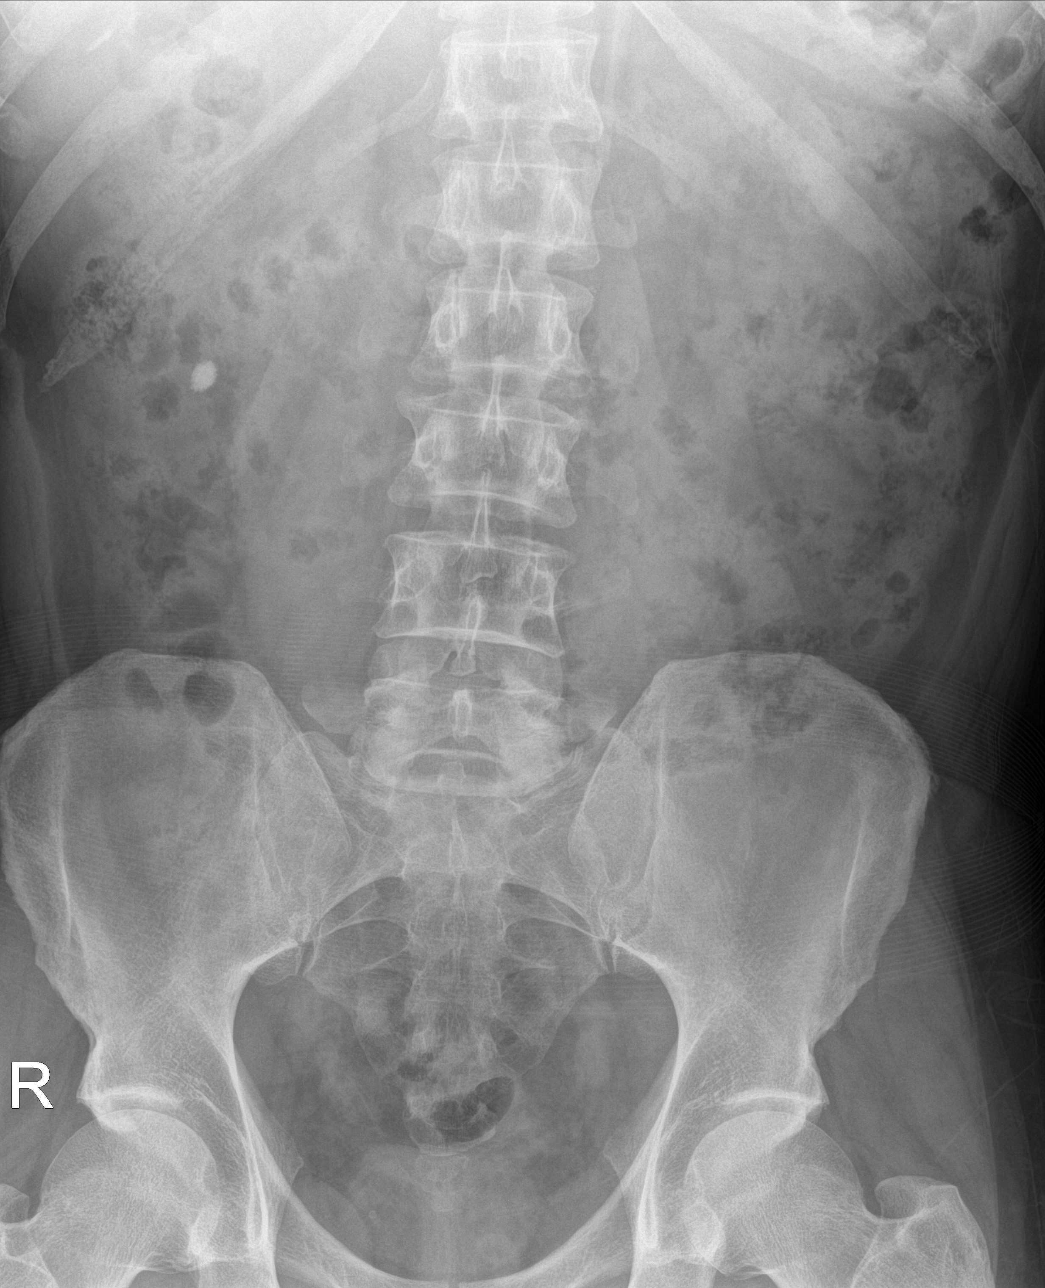

[3 of 3 positions shown; findings below may reference images not displayed]

FINDINGS: There is no evidence of dilated bowel loops or free intraperitoneal
air. Right inferior pole renal calculus is noted measuring 1 cm.
Tiny stone is noted within the inferior pole of the left kidney.
Heart size and mediastinal contours are within normal limits. Both
lungs are clear.
IMPRESSION: 1. Normal bowel gas pattern.
2. Bilateral renal calculi.

## 2016-09-21 DIAGNOSIS — D225 Melanocytic nevi of trunk: Secondary | ICD-10-CM | POA: Diagnosis not present

## 2016-09-21 DIAGNOSIS — L400 Psoriasis vulgaris: Secondary | ICD-10-CM | POA: Diagnosis not present

## 2016-09-21 DIAGNOSIS — L821 Other seborrheic keratosis: Secondary | ICD-10-CM | POA: Diagnosis not present

## 2016-09-21 DIAGNOSIS — L814 Other melanin hyperpigmentation: Secondary | ICD-10-CM | POA: Diagnosis not present

## 2016-09-22 DIAGNOSIS — F419 Anxiety disorder, unspecified: Secondary | ICD-10-CM | POA: Diagnosis not present

## 2016-09-22 DIAGNOSIS — R208 Other disturbances of skin sensation: Secondary | ICD-10-CM | POA: Diagnosis not present

## 2016-10-27 DIAGNOSIS — M545 Low back pain: Secondary | ICD-10-CM | POA: Diagnosis not present

## 2016-10-27 DIAGNOSIS — M79673 Pain in unspecified foot: Secondary | ICD-10-CM | POA: Diagnosis not present

## 2016-10-27 DIAGNOSIS — R42 Dizziness and giddiness: Secondary | ICD-10-CM | POA: Diagnosis not present

## 2016-10-27 DIAGNOSIS — R251 Tremor, unspecified: Secondary | ICD-10-CM | POA: Diagnosis not present

## 2016-12-09 DIAGNOSIS — M545 Low back pain: Secondary | ICD-10-CM | POA: Diagnosis not present

## 2016-12-09 DIAGNOSIS — N200 Calculus of kidney: Secondary | ICD-10-CM | POA: Diagnosis not present

## 2016-12-15 DIAGNOSIS — M791 Myalgia: Secondary | ICD-10-CM | POA: Diagnosis not present

## 2016-12-16 DIAGNOSIS — M545 Low back pain: Secondary | ICD-10-CM | POA: Diagnosis not present

## 2017-01-29 DIAGNOSIS — R1013 Epigastric pain: Secondary | ICD-10-CM | POA: Diagnosis not present

## 2017-01-29 DIAGNOSIS — Z86010 Personal history of colonic polyps: Secondary | ICD-10-CM | POA: Diagnosis not present

## 2017-01-29 DIAGNOSIS — K219 Gastro-esophageal reflux disease without esophagitis: Secondary | ICD-10-CM | POA: Diagnosis not present

## 2017-02-03 DIAGNOSIS — N401 Enlarged prostate with lower urinary tract symptoms: Secondary | ICD-10-CM | POA: Diagnosis not present

## 2017-02-03 DIAGNOSIS — Z87442 Personal history of urinary calculi: Secondary | ICD-10-CM | POA: Diagnosis not present

## 2017-02-03 DIAGNOSIS — R109 Unspecified abdominal pain: Secondary | ICD-10-CM | POA: Diagnosis not present

## 2017-03-10 DIAGNOSIS — Z23 Encounter for immunization: Secondary | ICD-10-CM | POA: Diagnosis not present

## 2017-03-16 DIAGNOSIS — M545 Low back pain: Secondary | ICD-10-CM | POA: Diagnosis not present

## 2017-05-03 DIAGNOSIS — L400 Psoriasis vulgaris: Secondary | ICD-10-CM | POA: Diagnosis not present

## 2017-05-03 DIAGNOSIS — L821 Other seborrheic keratosis: Secondary | ICD-10-CM | POA: Diagnosis not present

## 2017-05-03 DIAGNOSIS — L538 Other specified erythematous conditions: Secondary | ICD-10-CM | POA: Diagnosis not present

## 2017-05-03 DIAGNOSIS — L918 Other hypertrophic disorders of the skin: Secondary | ICD-10-CM | POA: Diagnosis not present

## 2017-05-03 DIAGNOSIS — D1801 Hemangioma of skin and subcutaneous tissue: Secondary | ICD-10-CM | POA: Diagnosis not present

## 2017-05-03 DIAGNOSIS — D485 Neoplasm of uncertain behavior of skin: Secondary | ICD-10-CM | POA: Diagnosis not present

## 2017-05-04 DIAGNOSIS — L409 Psoriasis, unspecified: Secondary | ICD-10-CM | POA: Diagnosis not present

## 2017-05-04 DIAGNOSIS — Z79899 Other long term (current) drug therapy: Secondary | ICD-10-CM | POA: Diagnosis not present

## 2017-05-14 DIAGNOSIS — M545 Low back pain: Secondary | ICD-10-CM | POA: Diagnosis not present

## 2017-06-16 DIAGNOSIS — R42 Dizziness and giddiness: Secondary | ICD-10-CM | POA: Diagnosis not present

## 2017-07-22 DIAGNOSIS — M545 Low back pain: Secondary | ICD-10-CM | POA: Diagnosis not present

## 2017-07-22 DIAGNOSIS — R42 Dizziness and giddiness: Secondary | ICD-10-CM | POA: Diagnosis not present

## 2017-07-22 DIAGNOSIS — F419 Anxiety disorder, unspecified: Secondary | ICD-10-CM | POA: Diagnosis not present

## 2017-07-22 DIAGNOSIS — R251 Tremor, unspecified: Secondary | ICD-10-CM | POA: Diagnosis not present

## 2017-08-27 DIAGNOSIS — M545 Low back pain: Secondary | ICD-10-CM | POA: Diagnosis not present

## 2017-08-31 DIAGNOSIS — R109 Unspecified abdominal pain: Secondary | ICD-10-CM | POA: Diagnosis not present

## 2017-08-31 DIAGNOSIS — N200 Calculus of kidney: Secondary | ICD-10-CM | POA: Diagnosis not present

## 2017-08-31 DIAGNOSIS — R300 Dysuria: Secondary | ICD-10-CM | POA: Diagnosis not present

## 2017-08-31 DIAGNOSIS — N400 Enlarged prostate without lower urinary tract symptoms: Secondary | ICD-10-CM | POA: Diagnosis not present

## 2017-09-14 DIAGNOSIS — E7800 Pure hypercholesterolemia, unspecified: Secondary | ICD-10-CM | POA: Diagnosis not present

## 2017-09-14 DIAGNOSIS — R399 Unspecified symptoms and signs involving the genitourinary system: Secondary | ICD-10-CM | POA: Diagnosis not present

## 2017-09-14 DIAGNOSIS — Z0000 Encounter for general adult medical examination without abnormal findings: Secondary | ICD-10-CM | POA: Diagnosis not present

## 2017-09-15 DIAGNOSIS — R300 Dysuria: Secondary | ICD-10-CM | POA: Diagnosis not present

## 2017-09-15 DIAGNOSIS — N401 Enlarged prostate with lower urinary tract symptoms: Secondary | ICD-10-CM | POA: Diagnosis not present

## 2017-09-15 DIAGNOSIS — R3129 Other microscopic hematuria: Secondary | ICD-10-CM | POA: Diagnosis not present

## 2017-09-15 DIAGNOSIS — R109 Unspecified abdominal pain: Secondary | ICD-10-CM | POA: Diagnosis not present

## 2017-09-15 DIAGNOSIS — N400 Enlarged prostate without lower urinary tract symptoms: Secondary | ICD-10-CM | POA: Diagnosis not present

## 2017-09-23 DIAGNOSIS — R319 Hematuria, unspecified: Secondary | ICD-10-CM | POA: Diagnosis not present

## 2017-09-23 DIAGNOSIS — N200 Calculus of kidney: Secondary | ICD-10-CM | POA: Diagnosis not present

## 2017-10-12 DIAGNOSIS — R350 Frequency of micturition: Secondary | ICD-10-CM | POA: Diagnosis not present

## 2017-10-12 DIAGNOSIS — R3915 Urgency of urination: Secondary | ICD-10-CM | POA: Diagnosis not present

## 2017-10-12 DIAGNOSIS — R3914 Feeling of incomplete bladder emptying: Secondary | ICD-10-CM | POA: Diagnosis not present

## 2017-10-12 DIAGNOSIS — N401 Enlarged prostate with lower urinary tract symptoms: Secondary | ICD-10-CM | POA: Diagnosis not present

## 2017-10-12 DIAGNOSIS — R109 Unspecified abdominal pain: Secondary | ICD-10-CM | POA: Diagnosis not present

## 2017-11-09 DIAGNOSIS — R310 Gross hematuria: Secondary | ICD-10-CM | POA: Diagnosis not present

## 2017-11-09 DIAGNOSIS — N401 Enlarged prostate with lower urinary tract symptoms: Secondary | ICD-10-CM | POA: Diagnosis not present

## 2017-11-09 DIAGNOSIS — R3914 Feeling of incomplete bladder emptying: Secondary | ICD-10-CM | POA: Diagnosis not present

## 2017-11-09 DIAGNOSIS — N200 Calculus of kidney: Secondary | ICD-10-CM | POA: Diagnosis not present

## 2017-11-16 DIAGNOSIS — W57XXXA Bitten or stung by nonvenomous insect and other nonvenomous arthropods, initial encounter: Secondary | ICD-10-CM | POA: Diagnosis not present

## 2017-11-16 DIAGNOSIS — L299 Pruritus, unspecified: Secondary | ICD-10-CM | POA: Diagnosis not present

## 2017-11-22 DIAGNOSIS — M545 Low back pain: Secondary | ICD-10-CM | POA: Diagnosis not present

## 2017-11-22 DIAGNOSIS — R208 Other disturbances of skin sensation: Secondary | ICD-10-CM | POA: Diagnosis not present

## 2017-11-22 DIAGNOSIS — R251 Tremor, unspecified: Secondary | ICD-10-CM | POA: Diagnosis not present

## 2017-11-22 DIAGNOSIS — R42 Dizziness and giddiness: Secondary | ICD-10-CM | POA: Diagnosis not present

## 2017-11-23 DIAGNOSIS — R102 Pelvic and perineal pain: Secondary | ICD-10-CM | POA: Diagnosis not present

## 2017-11-23 DIAGNOSIS — N200 Calculus of kidney: Secondary | ICD-10-CM | POA: Diagnosis not present

## 2017-11-26 DIAGNOSIS — M545 Low back pain: Secondary | ICD-10-CM | POA: Diagnosis not present

## 2017-12-28 ENCOUNTER — Other Ambulatory Visit: Payer: Self-pay

## 2017-12-28 ENCOUNTER — Ambulatory Visit: Payer: BLUE CROSS/BLUE SHIELD | Attending: Urology | Admitting: Physical Therapy

## 2017-12-28 ENCOUNTER — Encounter: Payer: Self-pay | Admitting: Physical Therapy

## 2017-12-28 DIAGNOSIS — G8929 Other chronic pain: Secondary | ICD-10-CM

## 2017-12-28 DIAGNOSIS — M545 Low back pain, unspecified: Secondary | ICD-10-CM

## 2017-12-28 DIAGNOSIS — R279 Unspecified lack of coordination: Secondary | ICD-10-CM | POA: Diagnosis present

## 2017-12-28 DIAGNOSIS — M62838 Other muscle spasm: Secondary | ICD-10-CM | POA: Insufficient documentation

## 2017-12-28 DIAGNOSIS — M6281 Muscle weakness (generalized): Secondary | ICD-10-CM | POA: Insufficient documentation

## 2017-12-28 NOTE — Patient Instructions (Signed)
Access Code: [email or web address]  URL: [email or web address]  Date: 12/28/2017  Prepared by: Lovett Calender   Exercises  Seated Hamstring Stretch - 10 reps - 3 sets - 1x daily - 7x weekly

## 2017-12-28 NOTE — Therapy (Signed)
[facility] [address], [place], [place], [address] Phone: [phone]   Fax:  [phone]  Physical Therapy Evaluation  Patient Details  Name: Scott Dominguez MRN: [id number] Date of Birth: 1956-01-19 Referring Provider: Melinda Crutch, MD   Encounter Date: 12/28/2017  PT End of Session - 12/28/17 1758    Visit Number  1    Date for PT Re-Evaluation  03/22/18    PT Start Time  0845    PT Stop Time  0928    PT Time Calculation (min)  43 min    Activity Tolerance  Patient tolerated treatment well    Behavior During Therapy  [facility] for tasks assessed/performed       Past Medical History:  Diagnosis Date  . Anxiety   . Asthma    no porblem in a year  . Chronic abdominal pain   . Chronic foot pain    bilateral  . Depression   . GERD (gastroesophageal reflux disease)   . Headache   . Kidney stones    multiple over the past 30 years  . Psoriasis   . Vertigo     Past Surgical History:  Procedure Laterality Date  . APPENDECTOMY    . CHOLECYSTECTOMY N/A 07/11/2014   Procedure: LAPAROSCOPIC CHOLECYSTECTOMY;  Surgeon: [name], MD;  Location: [place];  Service: General;  Laterality: N/A;  . COLONOSCOPY    . CYSTOSCOPY WITH RETROGRADE PYELOGRAM, URETEROSCOPY AND STENT PLACEMENT Right 12/18/2014   Procedure: CYSTOSCOPY WITH RETROGRADE PYELOGRAM, URETEROSCOPY AND EXTRACTION STONE BASKE AND RIGHT STENT PLACEMENT;  Surgeon: Carolan Clines, MD;  [facility];  Service: Urology;  Laterality: Right;  . ear drum surgery     had graft  . HOLMIUM LASER APPLICATION Right 0/38/8828   Procedure: HOLMIUM LASER APPLICATION;  Surgeon: Carolan Clines, MD;  Location[facility];  Service: Urology;  Laterality: Right;  . LITHOTRIPSY  12/17/2014  . TONSILLECTOMY    . WISDOM TOOTH EXTRACTION      There were no vitals filed for this visit.   Subjective Assessment - 12/28/17 0849    Subjective  Bowel are full there is  pain from back to lower abdomen.  Some IBS, fecal urgency, Occasional leakage.  Lifting increases pain pain.  Nocturia 3-6x/night.      Pertinent History  history of abdominal surgeries, chronic pain, vertigo    Limitations  Lifting    Patient Stated Goals  be able to lift and decreased pain    Currently in Pain?  Yes    Pain Score  2    up to 6/10 with BM   Pain Location  Abdomen    Pain Orientation  Lower    Pain Type  Chronic pain   couple years   Pain Radiating Towards  wraps around the hips and low back    Pain Onset  More than a month ago    Pain Frequency  Intermittent    Aggravating Factors   BM and lifting    Pain Relieving Factors  heat on abdomen or back, walking and moving    Multiple Pain Sites  No         [facility] PT Assessment - 12/29/17 0001      Assessment   Medical Diagnosis  R10.2,G89.29 (ICD-10-CM) - Chronic male pelvic pain    Referring Provider  Melinda Crutch, MD    Prior Therapy  No      Precautions   Precautions  None      Restrictions   Weight Bearing Restrictions  No      Balance Screen   Has the patient fallen in the past 6 months  No      [place] residence    Living Arrangements  Spouse/significant other      Prior Function   Level of Independence  Independent    Vocation  Full time employment    Vocation Requirements  sitting/standing      Cognition   Overall Cognitive Status  Within Functional Limits for tasks assessed      Observation/Other Assessments   Observations  ribcage movement restriction in breathing      Posture/Postural Control   Posture/Postural Control  Postural limitations    Postural Limitations  Flexed trunk;Rounded Shoulders      AROM   Overall AROM Comments  lumbar flexion 50% limited, lumbar extension full ROM and feels good      Strength   Overall Strength Comments  Rt abduction and adduction 4-/5      Flexibility   Soft Tissue Assessment /Muscle Length  yes     Hamstrings  h/s 40% limited      Palpation   Palpation comment  lumbar paraspinals and thoracolumbar fascia restricted and tender, abdominal muscles recutus ab tender at insertions, hip flexors tender to palpation      Ambulation/Gait   Gait Pattern  Decreased stride length;Trunk flexed                Objective measurements completed on examination: See above findings.    Pelvic Floor Special Questions - 12/29/17 0001    Prior Pelvic/Prostate Exam  Yes    Currently Sexually Active  Yes    Is this Painful  No   not to completion?   Urinary Leakage  Yes    How often  after BM has to milk the urethra    Fecal incontinence  --   can be diarrhea and burning, occasional constipation   Skin Integrity  Intact    Pelvic Floor Internal Exam  pt informed and consent given to perform internal soft tissue assessment    Exam Type  Rectal    Palpation  very tight and tender to anal sphincters and puborectalis    Strength  fair squeeze, definite lift   pain with resistance   Strength # of reps  3    Strength # of seconds  4       [facility] Adult PT Treatment/Exercise - 12/29/17 0001      Self-Care   Self-Care  Other Self-Care Comments    Other Self-Care Comments   initial HEP             PT Education - 12/28/17 1802    Education Details   Access Code: [email or web address]     Person(s) Educated  Patient    Methods  Explanation;Demonstration;Handout;Verbal cues    Comprehension  Verbalized understanding;Returned demonstration       PT Short Term Goals - 12/29/17 1435      PT SHORT TERM GOAL #1   Title  pt will be ind with initial HEP    Time  4    Period  Weeks    Status  New    Target Date  01/25/18      PT SHORT TERM GOAL #2   Title  pt will report 20% reduction in pain    Time  4  Period  Weeks    Status  New    Target Date  01/25/18        PT Long Term Goals - 12/29/17 1436      PT LONG TERM GOAL #1   Title  ind with advanced HEP to manage pain symptoms     Time  12    Period  Weeks    Status  New    Target Date  03/22/18      PT LONG TERM GOAL #2   Title  pt will report nocutria of 2x or less per night    Time  12    Period  Weeks    Status  New    Target Date  03/22/18      PT LONG TERM GOAL #3   Title  pt will be able to lift 20 lb without increased pain    Time  12    Period  Weeks    Status  New    Target Date  03/22/18      PT LONG TERM GOAL #4   Title  pt will report at least 50% less pain    Time  12    Period  Weeks    Status  New    Target Date  03/22/18      PT LONG TERM GOAL #5   Title  pt will be able to relax and bulge pelvic floor muscles while coordinating TrA activation for functional activities such as using correct toileting techniques    Time  12    Period  Weeks    Status  New    Target Date  03/22/18             Plan - 12/28/17 1803    Clinical Impression Statement  Pt presents to clinic today due to chronic pelvic pain.  Pt has nocturia 3-6x/night and difficulty fully emptying bladder witout milking urethra.  He has low back pain that wraps around to low abdomen.  Pt has LE weakness as stated above.  Gait and postural abnormalities noted.  Pt has muscle spasms and fascial restrictions with a lot of tenderness and trigger points thorughout tight muscles.  Pt has decreased flexibility of h/s and lumbar paraspinals.  He has stiffness in ribcage noted with diphragmatic breathing.  pt has difficult relaxing pelvic floor muscle and high tone of pelvic floor.  Pt will benefit from skilled to address these impairments to return to maximum functional activities and improved quality of life.      History and Personal Factors relevant to plan of care:  history of abdominal surgeries, chronic pain, vertigo    Clinical Presentation  Evolving    Clinical Presentation due to:  has gotten worse over 2 years    Clinical Decision Making  Moderate    Rehab Potential  Good    PT Frequency  2x / week    PT Duration  12  weeks    PT Treatment/Interventions  ADLs/Self Care Home Management;Biofeedback;Cryotherapy;Electrical Stimulation;Moist Heat;Ultrasound;Therapeutic activities;Therapeutic exercise;Neuromuscular re-education;Patient/family education;Manual techniques;Dry needling;Passive range of motion;Scar mobilization;Taping    PT Next Visit Plan  abdominal massage, STM to lumbar and external to pelvic floor    PT Home Exercise Plan   Access Code: [email or web address]     Consulted and Agree with Plan of Care  Patient       Patient will benefit from skilled therapeutic intervention in order to improve the following deficits and impairments:  Abnormal gait, Increased  fascial restricitons, Pain, Increased muscle spasms, Impaired tone, Postural dysfunction, Decreased coordination, Decreased range of motion, Decreased strength, Impaired flexibility  Visit Diagnosis: Other muscle spasm  Muscle weakness (generalized)  Chronic low back pain without sciatica, unspecified back pain laterality  Unspecified lack of coordination     Problem List There are no active problems to display for this patient.   Zannie Cove, PT 12/29/2017, 2:41 PM  [facility] [address], [place], [place], [address] Phone: [phone]   Fax:  [phone]  Name: Scott Dominguez MRN: [id number] Date of Birth: 1955-09-18

## 2018-01-11 ENCOUNTER — Ambulatory Visit: Payer: BLUE CROSS/BLUE SHIELD | Admitting: Physical Therapy

## 2018-01-11 ENCOUNTER — Encounter: Payer: Self-pay | Admitting: Physical Therapy

## 2018-01-11 DIAGNOSIS — M62838 Other muscle spasm: Secondary | ICD-10-CM

## 2018-01-11 DIAGNOSIS — M545 Low back pain, unspecified: Secondary | ICD-10-CM

## 2018-01-11 DIAGNOSIS — M6281 Muscle weakness (generalized): Secondary | ICD-10-CM | POA: Diagnosis not present

## 2018-01-11 DIAGNOSIS — R279 Unspecified lack of coordination: Secondary | ICD-10-CM

## 2018-01-11 DIAGNOSIS — G8929 Other chronic pain: Secondary | ICD-10-CM | POA: Diagnosis not present

## 2018-01-11 NOTE — Therapy (Signed)
[facility] [address] [place], [place], [address] Phone: [phone]   Fax:  [phone]  Physical Therapy Treatment  Patient Details  Name: Scott Dominguez MRN: [id number] Date of Birth: 1956-04-01 Referring Provider: Melinda Crutch, MD   Encounter Date: 01/11/2018  PT End of Session - 01/11/18 0803    Visit Number  2    Date for PT Re-Evaluation  03/22/18    PT Start Time  0802    PT Stop Time  0840    PT Time Calculation (min)  38 min    Activity Tolerance  Patient tolerated treatment well;Patient limited by pain    Behavior During Therapy  [facility] for tasks assessed/performed       Past Medical History:  Diagnosis Date  . Anxiety   . Asthma    no porblem in a year  . Chronic abdominal pain   . Chronic foot pain    bilateral  . Depression   . GERD (gastroesophageal reflux disease)   . Headache   . Kidney stones    multiple over the past 30 years  . Psoriasis   . Vertigo     Past Surgical History:  Procedure Laterality Date  . APPENDECTOMY    . CHOLECYSTECTOMY N/A 07/11/2014   Procedure: LAPAROSCOPIC CHOLECYSTECTOMY;  Surgeon: [name], MD;  Location: [place];  Service: General;  Laterality: N/A;  . COLONOSCOPY    . CYSTOSCOPY WITH RETROGRADE PYELOGRAM, URETEROSCOPY AND STENT PLACEMENT Right 12/18/2014   Procedure: CYSTOSCOPY WITH RETROGRADE PYELOGRAM, URETEROSCOPY AND EXTRACTION STONE BASKE AND RIGHT STENT PLACEMENT;  Surgeon: Carolan Clines, MD;  [facility];  Service: Urology;  Laterality: Right;  . ear drum surgery     had graft  . HOLMIUM LASER APPLICATION Right 4/43/1540   Procedure: HOLMIUM LASER APPLICATION;  Surgeon: Carolan Clines, MD;  [facility];  Service: Urology;  Laterality: Right;  . LITHOTRIPSY  12/17/2014  . TONSILLECTOMY    . WISDOM TOOTH EXTRACTION      There were no vitals filed for this visit.  Subjective Assessment - 01/11/18 0803    Subjective  I  have good days and bad.  I only got up 1x last night.  I had a lot of stress last week at trade show with work.    Patient Stated Goals  be able to lift and decreased pain    Currently in Pain?  Yes    Pain Score  4     Pain Location  Abdomen    Pain Orientation  Lower    Pain Descriptors / Indicators  Aching    Pain Onset  More than a month ago    Aggravating Factors   BM and lifting    Multiple Pain Sites  No                       [facility] Adult PT Treatment/Exercise - 01/11/18 0001      Neuro Re-ed    Neuro Re-ed Details   diaphragmatic breathing in supine and sitting positions      Exercises   Exercises  Lumbar      Lumbar Exercises: Supine   Other Supine Lumbar Exercises  thoracic mobility with towel - extension UE flexion with breathing      Manual Therapy   Manual Therapy  Myofascial release    Manual therapy comments  in supine, severe tenderness only tolerates low pressure    Myofascial  Release  abdominal fascial release tender especially along rectus abdominis and around ribcage             PT Education - 01/11/18 0840    Education Details  abdominal massage and  Access Code: [email or web address]     Person(s) Educated  Patient    Methods  Explanation;Demonstration;Verbal cues;Handout;Tactile cues    Comprehension  Verbalized understanding;Returned demonstration       PT Short Term Goals - 12/29/17 1435      PT SHORT TERM GOAL #1   Title  pt will be ind with initial HEP    Time  4    Period  Weeks    Status  New    Target Date  01/25/18      PT SHORT TERM GOAL #2   Title  pt will report 20% reduction in pain    Time  4    Period  Weeks    Status  New    Target Date  01/25/18        PT Long Term Goals - 12/29/17 1436      PT LONG TERM GOAL #1   Title  ind with advanced HEP to manage pain symptoms    Time  12    Period  Weeks    Status  New    Target Date  03/22/18      PT LONG TERM GOAL #2   Title  pt will report nocutria of 2x or  less per night    Time  12    Period  Weeks    Status  New    Target Date  03/22/18      PT LONG TERM GOAL #3   Title  pt will be able to lift 20 lb without increased pain    Time  12    Period  Weeks    Status  New    Target Date  03/22/18      PT LONG TERM GOAL #4   Title  pt will report at least 50% less pain    Time  12    Period  Weeks    Status  New    Target Date  03/22/18      PT LONG TERM GOAL #5   Title  pt will be able to relax and bulge pelvic floor muscles while coordinating TrA activation for functional activities such as using correct toileting techniques    Time  12    Period  Weeks    Status  New    Target Date  03/22/18            Plan - 01/11/18 0841    Clinical Impression Statement  Pt has severe tenderness to palpation throughout abdomen.  He demonstrates chest brreathing and needs cues to use diaphragm.  Pt was educated on abdominal massage to do in clockwise motion to help large intestine and desensitize fascia.  He has difficutly activation of TrA and obliques for ribcage movements to exhale fully.  Pt will benefit from skilled PT to improve soft tissue and muscle strength and coordination.    PT Treatment/Interventions  ADLs/Self Care Home Management;Biofeedback;Cryotherapy;Electrical Stimulation;Moist Heat;Ultrasound;Therapeutic activities;Therapeutic exercise;Neuromuscular re-education;Patient/family education;Manual techniques;Dry needling;Passive range of motion;Scar mobilization;Taping    PT Next Visit Plan  breathing with diaphragm, TrA and obliques for improved ribcage mobility, STM to lumbar and external to pelvic floor    PT Home Exercise Plan   Access Code: [email or web address]     Consulted and  Agree with Plan of Care  Patient       Patient will benefit from skilled therapeutic intervention in order to improve the following deficits and impairments:  Abnormal gait, Increased fascial restricitons, Pain, Increased muscle spasms, Impaired tone, Postural  dysfunction, Decreased coordination, Decreased range of motion, Decreased strength, Impaired flexibility  Visit Diagnosis: Other muscle spasm  Muscle weakness (generalized)  Chronic low back pain without sciatica, unspecified back pain laterality  Unspecified lack of coordination     Problem List There are no active problems to display for this patient.   Zannie Cove, PT 01/11/2018, 3:25 PM   [facility] [address] [place], [place], [address] Phone: [phone]   Fax:  [phone]  Name: MERRITT MCCRAVY MRN: [id number] Date of Birth: [birth year]

## 2018-01-11 NOTE — Patient Instructions (Signed)
Access Code: [email or web address]  URL: [email or web address]  Date: 01/11/2018  Prepared by: Lovett Calender   Exercises  Seated Hamstring Stretch - 10 reps - 3 sets - 1x daily - 7x weekly  Supine Diaphragmatic Breathing - 10 reps - 1 sets - 3x daily - 7x weekly

## 2018-01-13 ENCOUNTER — Ambulatory Visit: Payer: BLUE CROSS/BLUE SHIELD | Admitting: Physical Therapy

## 2018-01-13 ENCOUNTER — Encounter: Payer: Self-pay | Admitting: Physical Therapy

## 2018-01-13 DIAGNOSIS — M545 Low back pain: Secondary | ICD-10-CM

## 2018-01-13 DIAGNOSIS — M62838 Other muscle spasm: Secondary | ICD-10-CM | POA: Diagnosis not present

## 2018-01-13 DIAGNOSIS — R279 Unspecified lack of coordination: Secondary | ICD-10-CM

## 2018-01-13 DIAGNOSIS — G8929 Other chronic pain: Secondary | ICD-10-CM

## 2018-01-13 DIAGNOSIS — M6281 Muscle weakness (generalized): Secondary | ICD-10-CM

## 2018-01-13 NOTE — Patient Instructions (Signed)
Access Code: [email or web address]  URL: [email or web address]  Date: 01/13/2018  Prepared by: Lovett Calender   Exercises  Seated Hamstring Stretch - 10 reps - 3 sets - 1x daily - 7x weekly  Supine Diaphragmatic Breathing - 10 reps - 1 sets - 3x daily - 7x weekly  Seated Scapular Retraction - 10 reps - 1 sets - 5 sec hold - 3x daily - 7x weekly  Seated Cervical Retraction - 10 reps - 1 sets - 5 sec hold - 3x daily - 7x weekly

## 2018-01-13 NOTE — Therapy (Signed)
[facility] [address], [place], [place], [address] Phone: [phone]   Fax:  [phone]  Physical Therapy Treatment  Patient Details  Name: Scott Dominguez MRN: [id number] Date of Birth: 1956-01-20 Referring Provider: Melinda Crutch, MD   Encounter Date: 01/13/2018  PT End of Session - 01/13/18 0929    Visit Number  3    Date for PT Re-Evaluation  03/22/18    PT Start Time  0846    PT Stop Time  0929    PT Time Calculation (min)  43 min    Activity Tolerance  Patient tolerated treatment well;Patient limited by pain    Behavior During Therapy  [facility] for tasks assessed/performed       Past Medical History:  Diagnosis Date  . Anxiety   . Asthma    no porblem in a year  . Chronic abdominal pain   . Chronic foot pain    bilateral  . Depression   . GERD (gastroesophageal reflux disease)   . Headache   . Kidney stones    multiple over the past 30 years  . Psoriasis   . Vertigo     Past Surgical History:  Procedure Laterality Date  . APPENDECTOMY    . CHOLECYSTECTOMY N/A 07/11/2014   Procedure: LAPAROSCOPIC CHOLECYSTECTOMY;  Surgeon: [name], MD;  Location: [place];  Service: General;  Laterality: N/A;  . COLONOSCOPY    . CYSTOSCOPY WITH RETROGRADE PYELOGRAM, URETEROSCOPY AND STENT PLACEMENT Right 12/18/2014   Procedure: CYSTOSCOPY WITH RETROGRADE PYELOGRAM, URETEROSCOPY AND EXTRACTION STONE BASKE AND RIGHT STENT PLACEMENT;  Surgeon: Carolan Clines, MD;  [facility];  Service: Urology;  Laterality: Right;  . ear drum surgery     had graft  . HOLMIUM LASER APPLICATION Right 9/67/5916   Procedure: HOLMIUM LASER APPLICATION;  Surgeon: Carolan Clines, MD;  [facility];  Service: Urology;  Laterality: Right;  . LITHOTRIPSY  12/17/2014  . TONSILLECTOMY    . WISDOM TOOTH EXTRACTION      There were no vitals filed for this visit.  Subjective Assessment - 01/13/18 0919    Subjective  This  morning is a good morning because I am completely empty.  Last night was bad.      Patient Stated Goals  be able to lift and decreased pain    Currently in Pain?  Yes    Pain Score  3                                PT Education - 01/13/18 0928    Education Details   Access Code: [email or web address]     Person(s) Educated  Patient    Methods  Explanation;Demonstration;Verbal cues;Tactile cues    Comprehension  Verbalized understanding;Returned demonstration       PT Short Term Goals - 01/13/18 0951      PT SHORT TERM GOAL #1   Title  pt will be ind with initial HEP    Status  On-going      PT SHORT TERM GOAL #2   Title  pt will report 20% reduction in pain    Status  On-going        PT Long Term Goals - 12/29/17 1436      PT LONG TERM GOAL #1   Title  ind with advanced HEP to manage pain symptoms    Time  12    Period  Weeks    Status  New    Target Date  03/22/18      PT LONG TERM GOAL #2   Title  pt will report nocutria of 2x or less per night    Time  12    Period  Weeks    Status  New    Target Date  03/22/18      PT LONG TERM GOAL #3   Title  pt will be able to lift 20 lb without increased pain    Time  12    Period  Weeks    Status  New    Target Date  03/22/18      PT LONG TERM GOAL #4   Title  pt will report at least 50% less pain    Time  12    Period  Weeks    Status  New    Target Date  03/22/18      PT LONG TERM GOAL #5   Title  pt will be able to relax and bulge pelvic floor muscles while coordinating TrA activation for functional activities such as using correct toileting techniques    Time  12    Period  Weeks    Status  New    Target Date  03/22/18            Plan - 01/13/18 0941    Clinical Impression Statement  Patient had trigger point at T10-11 that referred to abdomen.  He has severe tenderness throughout lumbar and thoracic paraspinals.  He tolerated fascial release with some easing of tenderness.  Pt was able  to demonstrate improved posture and breathing techniques with cues.  Pt will benefit from skiled PT to work on increased spinal and ribcage mobilty and stability in order to manage symptoms.    PT Treatment/Interventions  ADLs/Self Care Home Management;Biofeedback;Cryotherapy;Electrical Stimulation;Moist Heat;Ultrasound;Therapeutic activities;Therapeutic exercise;Neuromuscular re-education;Patient/family education;Manual techniques;Dry needling;Passive range of motion;Scar mobilization;Taping    PT Next Visit Plan  initiate TrA program, thoracic mobility, STM lumbar and abdomen    PT Home Exercise Plan   Access Code: [email or web address]     Consulted and Agree with Plan of Care  Patient       Patient will benefit from skilled therapeutic intervention in order to improve the following deficits and impairments:  Abnormal gait, Increased fascial restricitons, Pain, Increased muscle spasms, Impaired tone, Postural dysfunction, Decreased coordination, Decreased range of motion, Decreased strength, Impaired flexibility  Visit Diagnosis: Other muscle spasm  Muscle weakness (generalized)  Chronic low back pain without sciatica, unspecified back pain laterality  Unspecified lack of coordination     Problem List There are no active problems to display for this patient.   Zannie Cove, PT 01/13/2018, 9:52 AM  [facility] [address] [place], [place], [address] Phone: [phone]   Fax:  [phone]  Name: Scott Dominguez MRN: [id number] Date of Birth: 12-06-1955

## 2018-01-18 ENCOUNTER — Encounter: Payer: Self-pay | Admitting: Physical Therapy

## 2018-01-18 ENCOUNTER — Ambulatory Visit: Payer: BLUE CROSS/BLUE SHIELD | Attending: Urology | Admitting: Physical Therapy

## 2018-01-18 DIAGNOSIS — G8929 Other chronic pain: Secondary | ICD-10-CM

## 2018-01-18 DIAGNOSIS — M6281 Muscle weakness (generalized): Secondary | ICD-10-CM

## 2018-01-18 DIAGNOSIS — M545 Low back pain, unspecified: Secondary | ICD-10-CM

## 2018-01-18 DIAGNOSIS — M62838 Other muscle spasm: Secondary | ICD-10-CM

## 2018-01-18 DIAGNOSIS — R279 Unspecified lack of coordination: Secondary | ICD-10-CM | POA: Diagnosis not present

## 2018-01-18 NOTE — Therapy (Signed)
[facility] [address] [place], [place], [address] Phone: [phone]   Fax:  [phone]  Physical Therapy Treatment  Patient Details  Name: Scott Dominguez MRN: [id number] Date of Birth: July 01, 1955 Referring Provider (PT): Melinda Crutch, MD   Encounter Date: 01/18/2018  PT End of Session - 01/18/18 0758    Visit Number  4    Date for PT Re-Evaluation  03/22/18    PT Start Time  0758    PT Stop Time  0844    PT Time Calculation (min)  46 min    Activity Tolerance  Patient tolerated treatment well;Patient limited by pain    Behavior During Therapy  [facility] for tasks assessed/performed       Past Medical History:  Diagnosis Date  . Anxiety   . Asthma    no porblem in a year  . Chronic abdominal pain   . Chronic foot pain    bilateral  . Depression   . GERD (gastroesophageal reflux disease)   . Headache   . Kidney stones    multiple over the past 30 years  . Psoriasis   . Vertigo     Past Surgical History:  Procedure Laterality Date  . APPENDECTOMY    . CHOLECYSTECTOMY N/A 07/11/2014   Procedure: LAPAROSCOPIC CHOLECYSTECTOMY;  Surgeon: [name], MD;  Location: [place];  Service: General;  Laterality: N/A;  . COLONOSCOPY    . CYSTOSCOPY WITH RETROGRADE PYELOGRAM, URETEROSCOPY AND STENT PLACEMENT Right 12/18/2014   Procedure: CYSTOSCOPY WITH RETROGRADE PYELOGRAM, URETEROSCOPY AND EXTRACTION STONE BASKE AND RIGHT STENT PLACEMENT;  Surgeon: Carolan Clines, MD;  Location: [facility];  Service: Urology;  Laterality: Right;  . ear drum surgery     had graft  . HOLMIUM LASER APPLICATION Right 3/82/5053   Procedure: HOLMIUM LASER APPLICATION;  Surgeon: Carolan Clines, MD;  Location: [facility];  Service: Urology;  Laterality: Right;  . LITHOTRIPSY  12/17/2014  . TONSILLECTOMY    . WISDOM TOOTH EXTRACTION      There were no vitals filed for this visit.  Subjective Assessment - 01/18/18 0758    Subjective   I am having a lot of pain from the kidney stones.  I made an appointment to talk to the surgeon about removing them.  Last time helped because I didn't notice the pain.    Pertinent History  history of abdominal surgeries, chronic pain, vertigo    Limitations  Lifting    Patient Stated Goals  be able to lift and decreased pain    Currently in Pain?  Yes    Pain Score  5     Pain Location  Back    Pain Orientation  Right;Left    Pain Descriptors / Indicators  Aching    Pain Type  Chronic pain    Pain Onset  More than a month ago    Pain Frequency  Intermittent                       [facility] Adult PT Treatment/Exercise - 01/18/18 0001      Exercises   Exercises  Lumbar      Lumbar Exercises: Stretches   Active Hamstring Stretch  Right;Left;30 seconds    Other Lumbar Stretch Exercise  thoracic rotation sidelying    Other Lumbar Stretch Exercise  thoracic ext sitting      Lumbar Exercises: Quadruped   [place]  --   one attempt with  increased lumbar pain     Manual Therapy   Soft tissue mobilization  thoracic paraspinals    Myofascial Release  abdominal fascial release tender especially along rectus abdominis and around ribcage, lumbar fascial release             PT Education - 01/18/18 0853    Education Details   Access Code: [email or web address]     Person(s) Educated  Patient    Methods  Explanation;Demonstration    Comprehension  Verbalized understanding;Returned demonstration       PT Short Term Goals - 01/13/18 0951      PT SHORT TERM GOAL #1   Title  pt will be ind with initial HEP    Status  On-going      PT SHORT TERM GOAL #2   Title  pt will report 20% reduction in pain    Status  On-going        PT Long Term Goals - 12/29/17 1436      PT LONG TERM GOAL #1   Title  ind with advanced HEP to manage pain symptoms    Time  12    Period  Weeks    Status  New    Target Date  03/22/18      PT LONG TERM GOAL #2   Title  pt will report  nocutria of 2x or less per night    Time  12    Period  Weeks    Status  New    Target Date  03/22/18      PT LONG TERM GOAL #3   Title  pt will be able to lift 20 lb without increased pain    Time  12    Period  Weeks    Status  New    Target Date  03/22/18      PT LONG TERM GOAL #4   Title  pt will report at least 50% less pain    Time  12    Period  Weeks    Status  New    Target Date  03/22/18      PT LONG TERM GOAL #5   Title  pt will be able to relax and bulge pelvic floor muscles while coordinating TrA activation for functional activities such as using correct toileting techniques    Time  12    Period  Weeks    Status  New    Target Date  03/22/18            Plan - 01/18/18 1027    Clinical Impression Statement  Patient continues to have stiffness and muscle spasm in low back and thoracic region.  He can tolerate light pressure throughout.  Pt has relief after treatments and was able to get increased thoracic movement after manual treatment.  He will continue to benefit from skilled PT to address soft tissue impairments and improve mobility for functional tasks.    PT Treatment/Interventions  ADLs/Self Care Home Management;Biofeedback;Cryotherapy;Electrical Stimulation;Moist Heat;Ultrasound;Therapeutic activities;Therapeutic exercise;Neuromuscular re-education;Patient/family education;Manual techniques;Dry needling;Passive range of motion;Scar mobilization;Taping    PT Next Visit Plan  initiate TrA program, thoracic mobility, STM lumbar and abdomen fascial release    PT Home Exercise Plan   Access Code: [email or web address]     Consulted and Agree with Plan of Care  Patient       Patient will benefit from skilled therapeutic intervention in order to improve the following deficits and impairments:  Abnormal gait, Increased fascial restricitons, Pain, Increased  muscle spasms, Impaired tone, Postural dysfunction, Decreased coordination, Decreased range of motion, Decreased  strength, Impaired flexibility  Visit Diagnosis: Other muscle spasm  Muscle weakness (generalized)  Chronic low back pain without sciatica, unspecified back pain laterality  Unspecified lack of coordination     Problem List There are no active problems to display for this patient.   Zannie Cove, PT 01/18/2018, 10:30 AM  [facility] [address] [place], [place], [address] Phone: [phone]   Fax:  [phone]  Name: Scott Dominguez MRN: [id number] Date of Birth: 01-17-1956

## 2018-01-18 NOTE — Patient Instructions (Signed)
Access Code: [email or web address]  URL: [email or web address]  Date: 01/18/2018  Prepared by: Lovett Calender   Exercises  Seated Hamstring Stretch - 10 reps - 3 sets - 1x daily - 7x weekly  Supine Diaphragmatic Breathing - 10 reps - 1 sets - 3x daily - 7x weekly  Seated Scapular Retraction - 10 reps - 1 sets - 5 sec hold - 3x daily - 7x weekly  Seated Cervical Retraction - 10 reps - 1 sets - 5 sec hold - 3x daily - 7x weekly  Sidelying Thoracic Rotation with Open Book - 5 reps - 1 sets - 5 sec hold - 1x daily - 7x weekly  Seated Thoracic Lumbar Extension - 5 reps - 1 sets - 5 sec hold - 1x daily - 7x weekly

## 2018-01-19 DIAGNOSIS — L821 Other seborrheic keratosis: Secondary | ICD-10-CM | POA: Diagnosis not present

## 2018-01-19 DIAGNOSIS — L400 Psoriasis vulgaris: Secondary | ICD-10-CM | POA: Diagnosis not present

## 2018-01-19 DIAGNOSIS — L814 Other melanin hyperpigmentation: Secondary | ICD-10-CM | POA: Diagnosis not present

## 2018-01-19 DIAGNOSIS — D229 Melanocytic nevi, unspecified: Secondary | ICD-10-CM | POA: Diagnosis not present

## 2018-01-21 ENCOUNTER — Ambulatory Visit: Payer: BLUE CROSS/BLUE SHIELD | Admitting: Physical Therapy

## 2018-01-21 ENCOUNTER — Encounter: Payer: Self-pay | Admitting: Physical Therapy

## 2018-01-21 DIAGNOSIS — M6281 Muscle weakness (generalized): Secondary | ICD-10-CM | POA: Diagnosis not present

## 2018-01-21 DIAGNOSIS — M545 Low back pain, unspecified: Secondary | ICD-10-CM

## 2018-01-21 DIAGNOSIS — R279 Unspecified lack of coordination: Secondary | ICD-10-CM | POA: Diagnosis not present

## 2018-01-21 DIAGNOSIS — M62838 Other muscle spasm: Secondary | ICD-10-CM

## 2018-01-21 DIAGNOSIS — G8929 Other chronic pain: Secondary | ICD-10-CM

## 2018-01-21 NOTE — Therapy (Signed)
[facility] [address] [place], [place], [address] Phone: [phone]   Fax:  [phone]  Physical Therapy Treatment  Patient Details  Name: Scott Dominguez MRN: [id number] Date of Birth: 12-12-1955 Referring Provider (PT): Melinda Crutch, MD   Encounter Date: 01/21/2018  PT End of Session - 01/21/18 1145    Visit Number  5    Date for PT Re-Evaluation  03/22/18    PT Start Time  0933    PT Stop Time  6962    PT Time Calculation (min)  41 min    Activity Tolerance  Patient tolerated treatment well;Patient limited by pain    Behavior During Therapy  [facility] for tasks assessed/performed       Past Medical History:  Diagnosis Date  . Anxiety   . Asthma    no porblem in a year  . Chronic abdominal pain   . Chronic foot pain    bilateral  . Depression   . GERD (gastroesophageal reflux disease)   . Headache   . Kidney stones    multiple over the past 30 years  . Psoriasis   . Vertigo     Past Surgical History:  Procedure Laterality Date  . APPENDECTOMY    . CHOLECYSTECTOMY N/A 07/11/2014   Procedure: LAPAROSCOPIC CHOLECYSTECTOMY;  Surgeon: [name], MD;  Location: [place];  Service: General;  Laterality: N/A;  . COLONOSCOPY    . CYSTOSCOPY WITH RETROGRADE PYELOGRAM, URETEROSCOPY AND STENT PLACEMENT Right 12/18/2014   Procedure: CYSTOSCOPY WITH RETROGRADE PYELOGRAM, URETEROSCOPY AND EXTRACTION STONE BASKE AND RIGHT STENT PLACEMENT;  Surgeon: Carolan Clines, MD;  Location[facility];  Service: Urology;  Laterality: Right;  . ear drum surgery     had graft  . HOLMIUM LASER APPLICATION Right 9/52/8413   Procedure: HOLMIUM LASER APPLICATION;  Surgeon: Carolan Clines, MD;  Location[facility];  Service: Urology;  Laterality: Right;  . LITHOTRIPSY  12/17/2014  . TONSILLECTOMY    . WISDOM TOOTH EXTRACTION      There were no vitals filed for this visit.  Subjective Assessment - 01/21/18 0938    Subjective   I had a really bad day yesterday.  I had to take pain medicine    Patient Stated Goals  be able to lift and decreased pain    Currently in Pain?  Yes    Pain Score  6                        [facility] Adult PT Treatment/Exercise - 01/21/18 0001      Neuro Re-ed    Neuro Re-ed Details   UE movements for improved diaphragmatic breathing      Lumbar Exercises: Stretches   Other Lumbar Stretch Exercise  thoracic extension supine with and without towel; UE flexion with breathing    Other Lumbar Stretch Exercise  thoracic ext sitting      Manual Therapy   Myofascial Release  abdominal fascial release at thoracic diaphragm and pelvic diaphragm               PT Short Term Goals - 01/13/18 0951      PT SHORT TERM GOAL #1   Title  pt will be ind with initial HEP    Status  On-going      PT SHORT TERM GOAL #2   Title  pt will report 20% reduction in pain    Status  On-going  PT Long Term Goals - 12/29/17 1436      PT LONG TERM GOAL #1   Title  ind with advanced HEP to manage pain symptoms    Time  12    Period  Weeks    Status  New    Target Date  03/22/18      PT LONG TERM GOAL #2   Title  pt will report nocutria of 2x or less per night    Time  12    Period  Weeks    Status  New    Target Date  03/22/18      PT LONG TERM GOAL #3   Title  pt will be able to lift 20 lb without increased pain    Time  12    Period  Weeks    Status  New    Target Date  03/22/18      PT LONG TERM GOAL #4   Title  pt will report at least 50% less pain    Time  12    Period  Weeks    Status  New    Target Date  03/22/18      PT LONG TERM GOAL #5   Title  pt will be able to relax and bulge pelvic floor muscles while coordinating TrA activation for functional activities such as using correct toileting techniques    Time  12    Period  Weeks    Status  New    Target Date  03/22/18            Plan - 01/21/18 1147    Clinical Impression Statement  Pt  had increased pain over this last week.  Pt responded well to fascial release reporting feeling like he could breathe much better after today's treatment.  Pt is making slow progress at this point due to complications from kidney stones.  He will continue to benefit from skilled PT to work on improved strength and mobility for return to maximum funciton.    PT Treatment/Interventions  ADLs/Self Care Home Management;Biofeedback;Cryotherapy;Electrical Stimulation;Moist Heat;Ultrasound;Therapeutic activities;Therapeutic exercise;Neuromuscular re-education;Patient/family education;Manual techniques;Dry needling;Passive range of motion;Scar mobilization;Taping    PT Next Visit Plan  initiate TrA program as able, thoracic mobility, STM lumbar and abdomen fascial release    PT Home Exercise Plan   Access Code: [email or web address]     Recommended Other Services  eval 9/11 second attempt to sign cert    Consulted and Agree with Plan of Care  Patient       Patient will benefit from skilled therapeutic intervention in order to improve the following deficits and impairments:  Abnormal gait, Increased fascial restricitons, Pain, Increased muscle spasms, Impaired tone, Postural dysfunction, Decreased coordination, Decreased range of motion, Decreased strength, Impaired flexibility  Visit Diagnosis: Other muscle spasm  Muscle weakness (generalized)  Chronic low back pain without sciatica, unspecified back pain laterality  Unspecified lack of coordination     Problem List There are no active problems to display for this patient.   Zannie Cove, PT 01/21/2018, 11:57 AM  [facility] [address], [place], [place], [address] Phone: [phone]   Fax:  [phone]  Name: Scott Dominguez MRN: [id number] Date of Birth: 08/03/1955

## 2018-01-24 ENCOUNTER — Ambulatory Visit: Payer: BLUE CROSS/BLUE SHIELD | Admitting: Physical Therapy

## 2018-01-24 ENCOUNTER — Encounter: Payer: Self-pay | Admitting: Physical Therapy

## 2018-01-24 DIAGNOSIS — M6281 Muscle weakness (generalized): Secondary | ICD-10-CM

## 2018-01-24 DIAGNOSIS — R279 Unspecified lack of coordination: Secondary | ICD-10-CM | POA: Diagnosis not present

## 2018-01-24 DIAGNOSIS — G8929 Other chronic pain: Secondary | ICD-10-CM | POA: Diagnosis not present

## 2018-01-24 DIAGNOSIS — M62838 Other muscle spasm: Secondary | ICD-10-CM | POA: Diagnosis not present

## 2018-01-24 DIAGNOSIS — M545 Low back pain: Secondary | ICD-10-CM | POA: Diagnosis not present

## 2018-01-24 NOTE — Patient Instructions (Signed)
Educated and performed clamshells

## 2018-01-24 NOTE — Therapy (Signed)
[facility] [address], [place], [place], [address] Phone: [phone]   Fax:  [phone]  Physical Therapy Treatment  Patient Details  Name: Scott Dominguez MRN: [id number] Date of Birth: 1955-12-20 Referring Provider (PT): Melinda Crutch, MD   Encounter Date: 01/24/2018  PT End of Session - 01/24/18 1355    Visit Number  6    Date for PT Re-Evaluation  03/22/18    PT Start Time  0258    PT Stop Time  1440    PT Time Calculation (min)  45 min    Activity Tolerance  Patient tolerated treatment well;Patient limited by pain    Behavior During Therapy  [facility] for tasks assessed/performed       Past Medical History:  Diagnosis Date  . Anxiety   . Asthma    no porblem in a year  . Chronic abdominal pain   . Chronic foot pain    bilateral  . Depression   . GERD (gastroesophageal reflux disease)   . Headache   . Kidney stones    multiple over the past 30 years  . Psoriasis   . Vertigo     Past Surgical History:  Procedure Laterality Date  . APPENDECTOMY    . CHOLECYSTECTOMY N/A 07/11/2014   Procedure: LAPAROSCOPIC CHOLECYSTECTOMY;  Surgeon: [name], MD;  Location: [place];  Service: General;  Laterality: N/A;  . COLONOSCOPY    . CYSTOSCOPY WITH RETROGRADE PYELOGRAM, URETEROSCOPY AND STENT PLACEMENT Right 12/18/2014   Procedure: CYSTOSCOPY WITH RETROGRADE PYELOGRAM, URETEROSCOPY AND EXTRACTION STONE BASKE AND RIGHT STENT PLACEMENT;  Surgeon: Carolan Clines, MD;  Location[facility];  Service: Urology;  Laterality: Right;  . ear drum surgery     had graft  . HOLMIUM LASER APPLICATION Right 09/14/7822   Procedure: HOLMIUM LASER APPLICATION;  Surgeon: Carolan Clines, MD;  [facility];  Service: Urology;  Laterality: Right;  . LITHOTRIPSY  12/17/2014  . TONSILLECTOMY    . WISDOM TOOTH EXTRACTION      There were no vitals filed for this visit.  Subjective Assessment - 01/24/18 1400    Subjective   I had a lot of pain last night and got a shot today.      Patient Stated Goals  be able to lift and decreased pain    Currently in Pain?  Yes    Pain Score  2     Pain Location  Back    Pain Orientation  Right;Left    Pain Descriptors / Indicators  Aching    Pain Type  Chronic pain    Pain Frequency  Intermittent    Multiple Pain Sites  No                       [facility] Adult PT Treatment/Exercise - 01/24/18 0001      Exercises   Exercises  Lumbar      Lumbar Exercises: Stretches   Other Lumbar Stretch Exercise  thoracic rotation in sidelying - 10x each side      Lumbar Exercises: Supine   Large Ball Oblique Isometric  20 reps;2 seconds   ball roll out and UE ball flexion - red pball     Lumbar Exercises: Sidelying   Clam  Right;10 reps    Hip Abduction  Right;10 reps      Lumbar Exercises: Prone   Straight Leg Raise  10 reps;1 second    Other Prone Lumbar Exercises  press up 5 x 5 sec (increased pain in chest)      Manual Therapy   Soft tissue mobilization  thoracic and lumbar paraspinals             PT Education - 01/24/18 1441    Education Details  educated and performed clamshells - did not add to handout due to website not responding    Person(s) Educated  Patient    Methods  Explanation;Demonstration;Handout;Verbal cues;Tactile cues    Comprehension  Verbalized understanding;Returned demonstration       PT Short Term Goals - 01/13/18 0951      PT SHORT TERM GOAL #1   Title  pt will be ind with initial HEP    Status  On-going      PT SHORT TERM GOAL #2   Title  pt will report 20% reduction in pain    Status  On-going        PT Long Term Goals - 12/29/17 1436      PT LONG TERM GOAL #1   Title  ind with advanced HEP to manage pain symptoms    Time  12    Period  Weeks    Status  New    Target Date  03/22/18      PT LONG TERM GOAL #2   Title  pt will report nocutria of 2x or less per night    Time  12    Period  Weeks     Status  New    Target Date  03/22/18      PT LONG TERM GOAL #3   Title  pt will be able to lift 20 lb without increased pain    Time  12    Period  Weeks    Status  New    Target Date  03/22/18      PT LONG TERM GOAL #4   Title  pt will report at least 50% less pain    Time  12    Period  Weeks    Status  New    Target Date  03/22/18      PT LONG TERM GOAL #5   Title  pt will be able to relax and bulge pelvic floor muscles while coordinating TrA activation for functional activities such as using correct toileting techniques    Time  12    Period  Weeks    Status  New    Target Date  03/22/18            Plan - 01/24/18 1443    Clinical Impression Statement  Pt had good release with manual techniques to thoracic paraspinals and fascia.  Pt was able to tolerate some core and glute strengthening.  Demonstrates weakness Rt>Lt .  He had less pain today due to getting and injection yesterday.  Pt will benefit from skilled PT to continue porgressing strength and mobilty.      PT Treatment/Interventions  ADLs/Self Care Home Management;Biofeedback;Cryotherapy;Electrical Stimulation;Moist Heat;Ultrasound;Therapeutic activities;Therapeutic exercise;Neuromuscular re-education;Patient/family education;Manual techniques;Dry needling;Passive range of motion;Scar mobilization;Taping    PT Next Visit Plan  f/u on clams, progress TrA program as able, thoracic mobility, STM lumbar and abdomen fascial release    Consulted and Agree with Plan of Care  Patient       Patient will benefit from skilled therapeutic intervention in order to improve the following deficits and impairments:  Abnormal gait, Increased fascial restricitons, Pain, Increased muscle spasms, Impaired tone, Postural dysfunction, Decreased coordination, Decreased  range of motion, Decreased strength, Impaired flexibility  Visit Diagnosis: Other muscle spasm  Muscle weakness (generalized)     Problem List There are no active  problems to display for this patient.   Zannie Cove, PT 01/24/2018, 4:54 PM  [facility] [address], [place], [place], [address] Phone: [phone]   Fax:  [phone]  Name: Scott Dominguez MRN: [id number] Date of Birth: 1955/10/16

## 2018-01-26 ENCOUNTER — Encounter: Payer: Self-pay | Admitting: Physical Therapy

## 2018-01-26 ENCOUNTER — Ambulatory Visit: Payer: BLUE CROSS/BLUE SHIELD | Admitting: Physical Therapy

## 2018-01-26 DIAGNOSIS — G8929 Other chronic pain: Secondary | ICD-10-CM

## 2018-01-26 DIAGNOSIS — M62838 Other muscle spasm: Secondary | ICD-10-CM | POA: Diagnosis not present

## 2018-01-26 DIAGNOSIS — M6281 Muscle weakness (generalized): Secondary | ICD-10-CM

## 2018-01-26 DIAGNOSIS — M545 Low back pain, unspecified: Secondary | ICD-10-CM

## 2018-01-26 DIAGNOSIS — R279 Unspecified lack of coordination: Secondary | ICD-10-CM

## 2018-01-26 NOTE — Patient Instructions (Signed)
Access Code: [email or web address]  URL: [email or web address]  Date: 01/26/2018  Prepared by: Lovett Calender   Exercises  Seated Hamstring Stretch - 10 reps - 3 sets - 1x daily - 7x weekly  Seated Figure 4 Piriformis Stretch - 5 reps - 1 sets - 1x daily - 7x weekly  Seated Scapular Retraction - 10 reps - 1 sets - 5 sec hold - 3x daily - 7x weekly  Seated Cervical Retraction - 10 reps - 1 sets - 5 sec hold - 3x daily - 7x weekly  Supine Diaphragmatic Breathing - 10 reps - 1 sets - 3x daily - 7x weekly  Sidelying Thoracic Rotation with Open Book - 5 reps - 1 sets - 5 sec hold - 1x daily - 7x weekly  Seated Thoracic Lumbar Extension - 5 reps - 1 sets - 5 sec hold - 1x daily - 7x weekly  Supine Hamstring Stretch with Strap - 3 reps - 1 sets - 30 sec hold - 1x daily - 7x weekly  Supine Hip Internal and External Rotation - 10 reps - 1 sets - 1x daily - 7x weekly  Standing Bilateral Low Shoulder Row with Anchored Resistance - 10 reps - 3 sets - 1x daily - 7x weekly  Shoulder extension with resistance - Neutral - 10 reps - 3 sets - 1x daily - 7x weekly

## 2018-01-26 NOTE — Therapy (Addendum)
[facility] [address], [place], [place], [address] Phone: [phone]   Fax:  [phone]  Physical Therapy Treatment  Patient Details  Name: TOSHIRO HANKEN MRN: [id number] Date of Birth: 06-Sep-1955 Referring Provider (PT): Melinda Crutch, MD   Encounter Date: 01/26/2018  PT End of Session - 01/26/18 0803    Visit Number  7    Date for PT Re-Evaluation  03/22/18    PT Start Time  0803    PT Stop Time  0846    PT Time Calculation (min)  43 min    Activity Tolerance  Patient tolerated treatment well;Patient limited by pain    Behavior During Therapy  [facility] for tasks assessed/performed       Past Medical History:  Diagnosis Date  . Anxiety   . Asthma    no porblem in a year  . Chronic abdominal pain   . Chronic foot pain    bilateral  . Depression   . GERD (gastroesophageal reflux disease)   . Headache   . Kidney stones    multiple over the past 30 years  . Psoriasis   . Vertigo     Past Surgical History:  Procedure Laterality Date  . APPENDECTOMY    . CHOLECYSTECTOMY N/A 07/11/2014   Procedure: LAPAROSCOPIC CHOLECYSTECTOMY;  Surgeon: [name], MD;  Location: [facility];  Service: General;  Laterality: N/A;  . COLONOSCOPY    . CYSTOSCOPY WITH RETROGRADE PYELOGRAM, URETEROSCOPY AND STENT PLACEMENT Right 12/18/2014   Procedure: CYSTOSCOPY WITH RETROGRADE PYELOGRAM, URETEROSCOPY AND EXTRACTION STONE BASKE AND RIGHT STENT PLACEMENT;  Surgeon: Carolan Clines, MD;  Location: [facility];  Service: Urology;  Laterality: Right;  . ear drum surgery     had graft  . HOLMIUM LASER APPLICATION Right 1/69/4503   Procedure: HOLMIUM LASER APPLICATION;  Surgeon: Carolan Clines, MD;  Location: [facility];  Service: Urology;  Laterality: Right;  . LITHOTRIPSY  12/17/2014  . TONSILLECTOMY    . WISDOM TOOTH EXTRACTION      There were no vitals filed for this visit.  Subjective Assessment - 01/26/18 0803    Subjective   I feel like I am having a good day.  I have good days and bad days.  Nothing is changing.  I feel like the pain from the kidney stones is getting in the way of everything.    Pertinent History  history of abdominal surgeries, chronic pain, vertigo    Limitations  Lifting    Patient Stated Goals  be able to lift and decreased pain    Currently in Pain?  Yes    Pain Score  3     Pain Location  Back    Pain Orientation  Right;Left    Pain Descriptors / Indicators  Aching    Pain Type  Chronic pain    Pain Onset  More than a month ago    Multiple Pain Sites  No                       [facility] Adult PT Treatment/Exercise - 01/26/18 0001      Exercises   Exercises  Lumbar      Lumbar Exercises: Stretches   Active Hamstring Stretch  Right;Left;5 reps;10 seconds   supine and sitting   Single Knee to Chest Stretch  Right;Left;5 reps;10 seconds    Figure 4 Stretch  3 reps;20 seconds;Seated;Supine    Other Lumbar Stretch Exercise  windshield  wipe stretch;       Lumbar Exercises: Standing   Row  Strengthening;Both;20 reps;Theraband    Theraband Level (Row)  Level 2 (Red)    Shoulder Extension  Strengthening;Both;20 reps;Theraband    Theraband Level (Shoulder Extension)  Level 2 (Red)      Manual Therapy   Myofascial Release  abdominal fascial release at thoracic diaphragm and pelvic diaphragm             PT Education - 01/26/18 0944    Education Details   Access Code: [email or web address]     Person(s) Educated  Patient    Methods  Explanation;Demonstration;Handout;Verbal cues    Comprehension  Verbalized understanding;Returned demonstration       PT Short Term Goals - 01/26/18 0948      PT SHORT TERM GOAL #1   Title  pt will be ind with initial HEP    Status  Achieved      PT SHORT TERM GOAL #2   Title  pt will report 20% reduction in pain    Baseline  comes and goes    Status  Not Met        PT Long Term Goals - 01/26/18 0948      PT LONG TERM GOAL #1   Title   ind with advanced HEP to manage pain symptoms    Status  On-going      PT LONG TERM GOAL #2   Title  pt will report nocutria of 2x or less per night    Status  On-going      PT LONG TERM GOAL #3   Title  pt will be able to lift 20 lb without increased pain    Status  On-going      PT LONG TERM GOAL #4   Title  pt will report at least 50% less pain    Status  On-going      PT LONG TERM GOAL #5   Title  pt will be able to relax and bulge pelvic floor muscles while coordinating TrA activation for functional activities such as using correct toileting techniques    Status  On-going            Plan - 01/26/18 0911    Clinical Impression Statement  Pt was able to do exercises and stretches without increased pain after treatment.  He reports stretch type of pain during stretches but was educated on importance of doing stretches at home.  Pt will continue with HEP and take a break from PT until after he is able to get surgery for kidney stones due to pain not getting better at this time and pt limited by amount of pain.    PT Treatment/Interventions  ADLs/Self Care Home Management;Biofeedback;Cryotherapy;Electrical Stimulation;Moist Heat;Ultrasound;Therapeutic activities;Therapeutic exercise;Neuromuscular re-education;Patient/family education;Manual techniques;Dry needling;Passive range of motion;Scar mobilization;Taping    PT Next Visit Plan  core strength, return to [facility] to pelvic floor as able if pain has subsided. hip flexor stretch    PT Home Exercise Plan   Access Code: [email or web address]     Consulted and Agree with Plan of Care  Patient       Patient will benefit from skilled therapeutic intervention in order to improve the following deficits and impairments:  Abnormal gait, Increased fascial restricitons, Pain, Increased muscle spasms, Impaired tone, Postural dysfunction, Decreased coordination, Decreased range of motion, Decreased strength, Impaired flexibility  Visit Diagnosis: Other  muscle spasm  Muscle weakness (generalized)  Chronic low back pain without sciatica, unspecified back  pain laterality  Unspecified lack of coordination     Problem List There are no active problems to display for this patient.   Zannie Cove, PT 01/26/2018, 9:49 AM  [facility] [address], [place], [place], [address] Phone: [phone]   Fax:  [phone]  Name: IFEANYI MICKELSON MRN: [id number] Date of Birth: 03-31-1956  PHYSICAL THERAPY DISCHARGE SUMMARY  Visits from Start of Care: 7  Current functional level related to goals / functional outcomes: See above details   Remaining deficits: See above   Education / Equipment: HEP  Plan: Patient agrees to discharge.  Patient goals were not met. Patient is being discharged due to lack of progress.  ?????    [employer], PT 03/22/18 8:17 AM

## 2018-02-01 ENCOUNTER — Encounter: Payer: BLUE CROSS/BLUE SHIELD | Admitting: Physical Therapy

## 2018-02-03 ENCOUNTER — Ambulatory Visit: Payer: BLUE CROSS/BLUE SHIELD | Admitting: Physical Therapy

## 2018-02-15 DIAGNOSIS — L409 Psoriasis, unspecified: Secondary | ICD-10-CM | POA: Diagnosis not present

## 2018-02-15 DIAGNOSIS — E781 Pure hyperglyceridemia: Secondary | ICD-10-CM | POA: Diagnosis not present

## 2018-02-15 DIAGNOSIS — G479 Sleep disorder, unspecified: Secondary | ICD-10-CM | POA: Diagnosis not present

## 2018-02-15 DIAGNOSIS — Z1389 Encounter for screening for other disorder: Secondary | ICD-10-CM | POA: Diagnosis not present

## 2018-02-16 DIAGNOSIS — N209 Urinary calculus, unspecified: Secondary | ICD-10-CM | POA: Diagnosis not present

## 2018-02-16 DIAGNOSIS — Z888 Allergy status to other drugs, medicaments and biological substances status: Secondary | ICD-10-CM | POA: Diagnosis not present

## 2018-02-16 DIAGNOSIS — Z91041 Radiographic dye allergy status: Secondary | ICD-10-CM | POA: Diagnosis not present

## 2018-02-16 DIAGNOSIS — Z87442 Personal history of urinary calculi: Secondary | ICD-10-CM | POA: Diagnosis not present

## 2018-02-16 DIAGNOSIS — Z9049 Acquired absence of other specified parts of digestive tract: Secondary | ICD-10-CM | POA: Diagnosis not present

## 2018-02-16 DIAGNOSIS — Z960 Presence of urogenital implants: Secondary | ICD-10-CM | POA: Diagnosis not present

## 2018-02-16 DIAGNOSIS — Z79899 Other long term (current) drug therapy: Secondary | ICD-10-CM | POA: Diagnosis not present

## 2018-02-16 DIAGNOSIS — N200 Calculus of kidney: Secondary | ICD-10-CM | POA: Diagnosis not present

## 2018-02-16 DIAGNOSIS — Z791 Long term (current) use of non-steroidal anti-inflammatories (NSAID): Secondary | ICD-10-CM | POA: Diagnosis not present

## 2018-02-22 DIAGNOSIS — N200 Calculus of kidney: Secondary | ICD-10-CM | POA: Diagnosis not present

## 2018-02-28 DIAGNOSIS — Z888 Allergy status to other drugs, medicaments and biological substances status: Secondary | ICD-10-CM | POA: Diagnosis not present

## 2018-02-28 DIAGNOSIS — N200 Calculus of kidney: Secondary | ICD-10-CM | POA: Diagnosis not present

## 2018-02-28 DIAGNOSIS — Z79899 Other long term (current) drug therapy: Secondary | ICD-10-CM | POA: Diagnosis not present

## 2018-02-28 DIAGNOSIS — R319 Hematuria, unspecified: Secondary | ICD-10-CM | POA: Diagnosis not present

## 2018-02-28 DIAGNOSIS — G8918 Other acute postprocedural pain: Secondary | ICD-10-CM | POA: Diagnosis not present

## 2018-02-28 DIAGNOSIS — Z91041 Radiographic dye allergy status: Secondary | ICD-10-CM | POA: Diagnosis not present

## 2018-02-28 DIAGNOSIS — R112 Nausea with vomiting, unspecified: Secondary | ICD-10-CM | POA: Diagnosis not present

## 2018-03-01 DIAGNOSIS — R112 Nausea with vomiting, unspecified: Secondary | ICD-10-CM | POA: Diagnosis not present

## 2018-03-01 DIAGNOSIS — K219 Gastro-esophageal reflux disease without esophagitis: Secondary | ICD-10-CM | POA: Diagnosis not present

## 2018-03-01 DIAGNOSIS — K591 Functional diarrhea: Secondary | ICD-10-CM | POA: Diagnosis not present

## 2018-03-03 DIAGNOSIS — N200 Calculus of kidney: Secondary | ICD-10-CM | POA: Diagnosis not present

## 2018-03-22 DIAGNOSIS — N200 Calculus of kidney: Secondary | ICD-10-CM | POA: Diagnosis not present

## 2018-03-22 DIAGNOSIS — F430 Acute stress reaction: Secondary | ICD-10-CM | POA: Diagnosis not present

## 2018-03-22 DIAGNOSIS — K219 Gastro-esophageal reflux disease without esophagitis: Secondary | ICD-10-CM | POA: Diagnosis not present

## 2018-03-22 DIAGNOSIS — R0789 Other chest pain: Secondary | ICD-10-CM | POA: Diagnosis not present

## 2018-04-11 DIAGNOSIS — N200 Calculus of kidney: Secondary | ICD-10-CM | POA: Diagnosis not present

## 2018-04-18 IMAGING — MR MR LUMBAR SPINE W/O CM
4 of 5 series · 20 of 48 positions shown · non-contrast
Comparison: CT Abdomen and Pelvis 07/30/2015.

CLINICAL DATA: 60-year-old male with 2 months of centralized lumbar
back pain located along waistline. No known injury. Calcaneal region
of both feet are numb. Left side sciatica. Initial encounter.

EXAM:
MRI LUMBAR SPINE WITHOUT CONTRAST
TECHNIQUE: Multiplanar, multisequence MR imaging of the lumbar spine was
performed. No intravenous contrast was administered.

[Series 6: T2 · sagittal · 4.0mm · 0.73mm/px · 7 of 15 slices shown (1 of 2)]
[im 1/15]
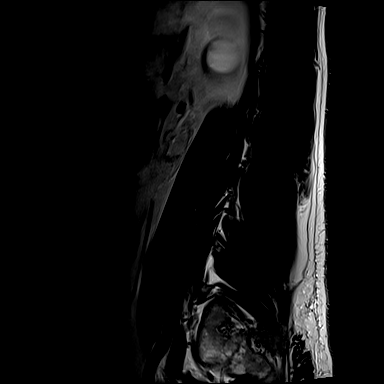
[im 3/15]
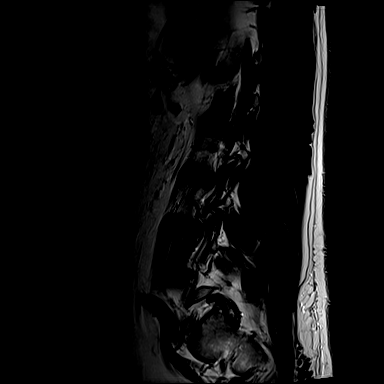
[im 5/15]
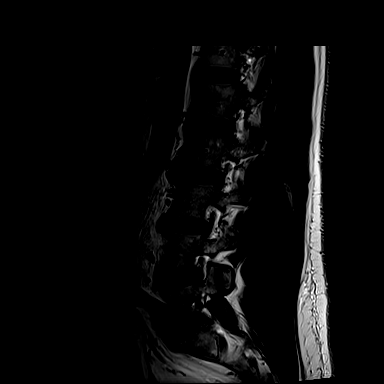
[im 8/15]
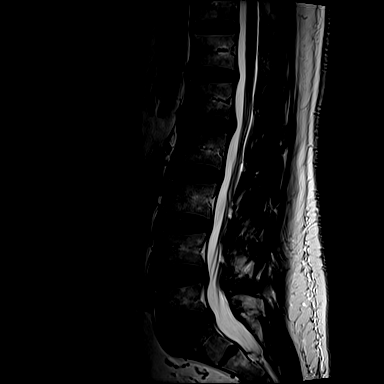
[im 10/15]
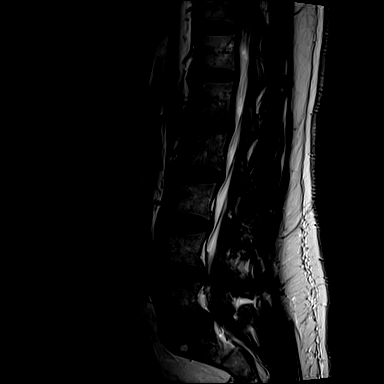
[im 12/15]
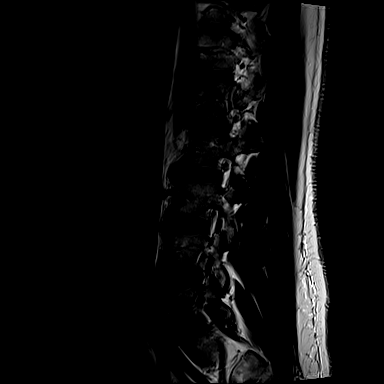
[im 15/15]
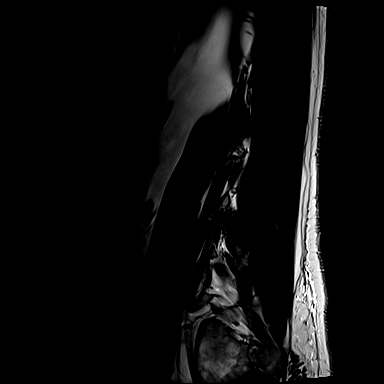

[Series 8: T1 · sagittal · 4.0mm · 0.73mm/px · 3 of 15 slices shown (1 of 2)]
[im 3/15]
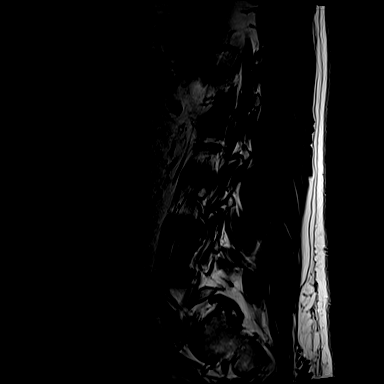
[im 9/15]
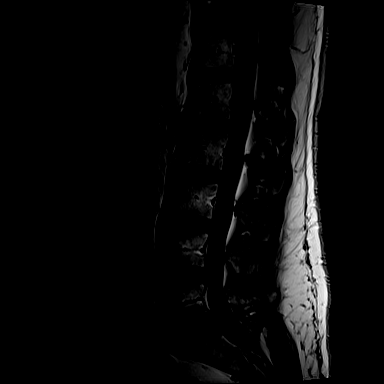
[im 15/15]
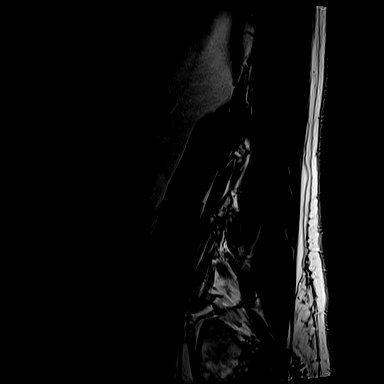

[Series 9: T1 · axial · 4.0mm · 0.56mm/px · z∈[-74,+64]mm · 3 of 34 slices shown (2 of 2)]
[im 6/34]
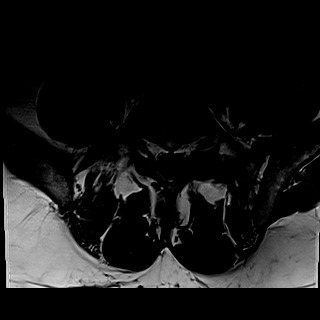
[im 18/34]
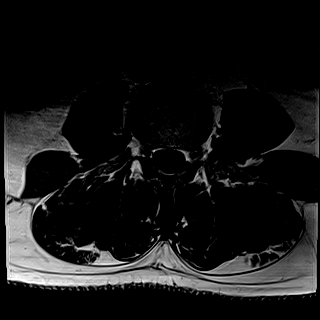
[im 28/34]
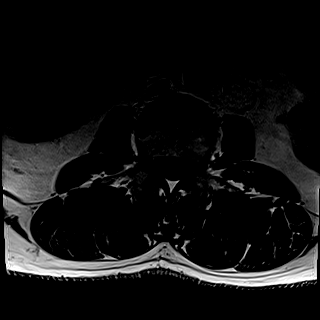

[Series 10: T2 · axial · 4.0mm · 0.28mm/px · z∈[-98,+64]mm · 7 of 34 slices shown (2 of 2)]
[im 1/34]
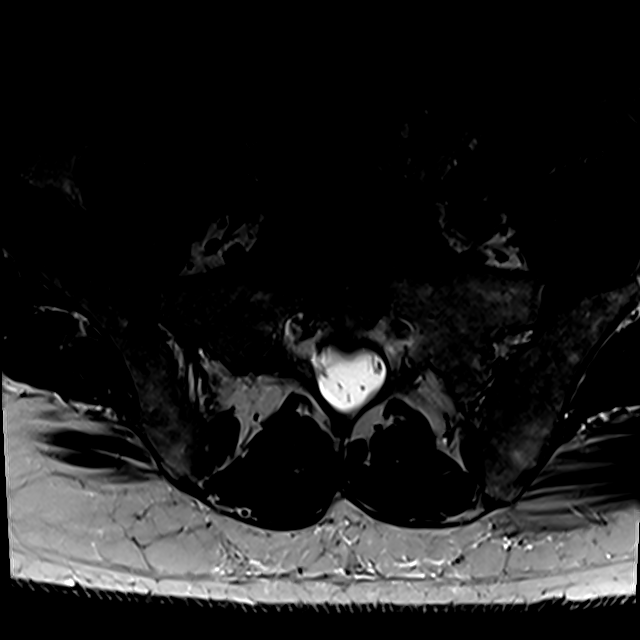
[im 6/34]
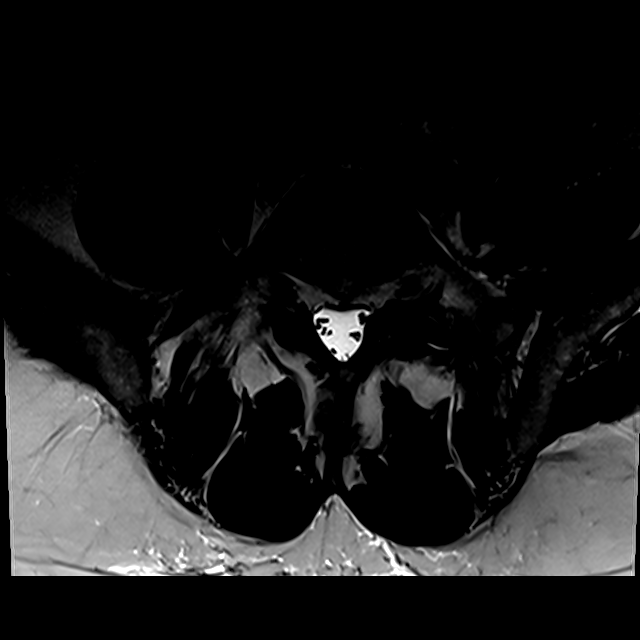
[im 11/34]
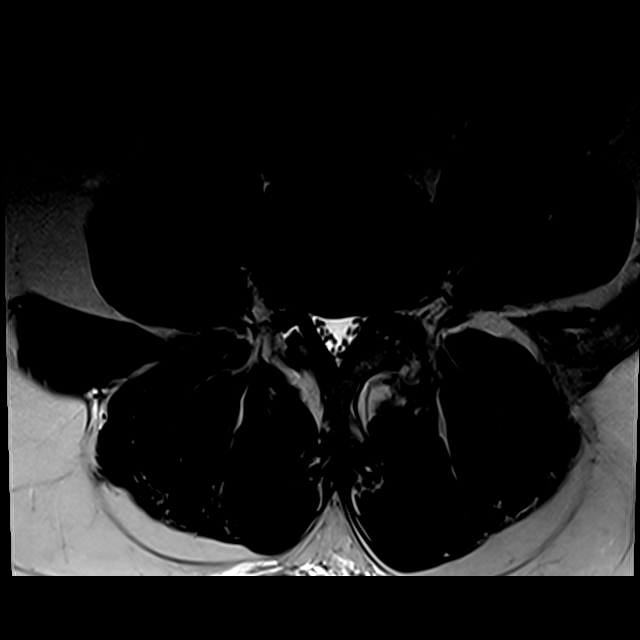
[im 16/34]
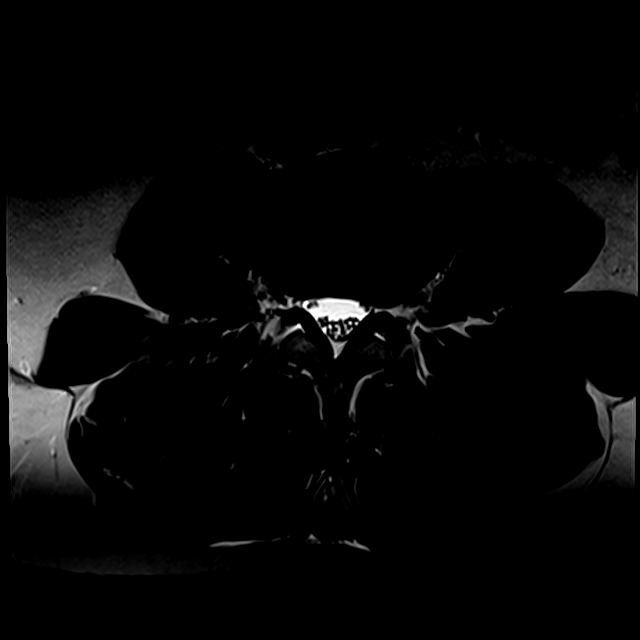
[im 18/34]
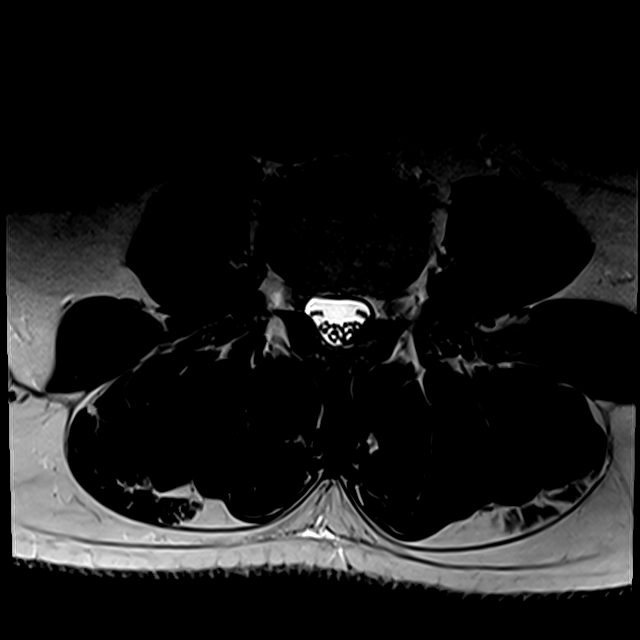
[im 23/34]
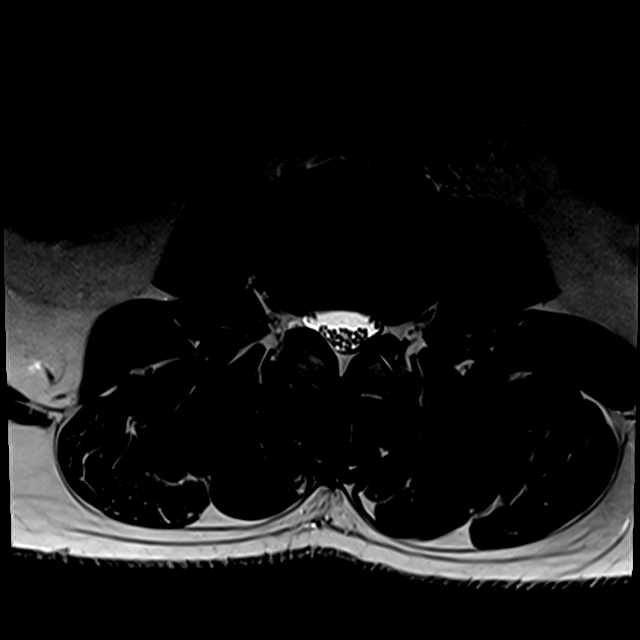
[im 28/34]
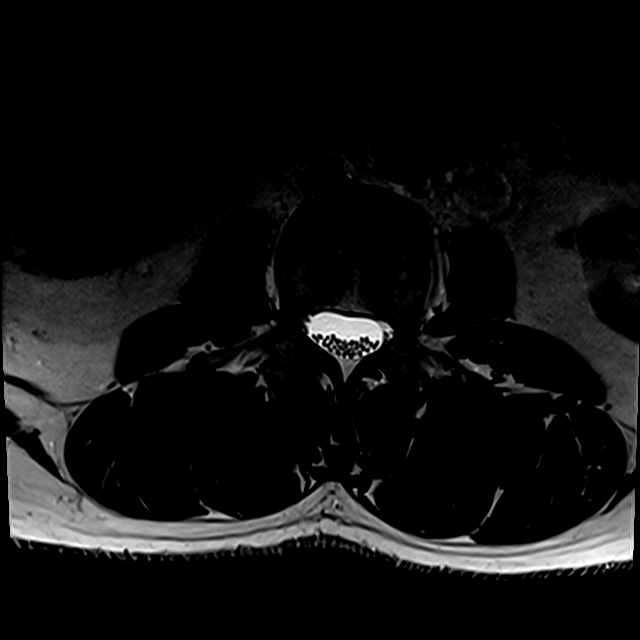

[20 of 48 positions shown; findings below may reference images not displayed]

FINDINGS: Segmentation:  Normal.

Alignment: Mild levoconvex lumbar scoliosis. Mild straightening of
upper lumbar lordosis. Stable vertebral height and alignment since
[place].

Vertebrae: No marrow edema or evidence of acute osseous abnormality.

Conus medullaris: Extends to the L1 level and appears normal.

Paraspinal and other soft tissues: Stable visualized abdominal
viscera. Negative visualized posterior paraspinal soft tissues.

Disc levels:

T11-T12:  Negative.

T12-L1:  Subtle central disc protrusion.  No stenosis.

L1-L2: Circumferential disc bulge. Small central disc extrusion with
mostly cephalad extension of disc material (series 10, image 3 and
series 6, image 8). Borderline to mild spinal stenosis and
effacement of the left lateral recess (descending left L2 nerve root
level). No foraminal stenosis.

L2-L3: Mild mostly anterior and lateral disc bulging. Mild facet
hypertrophy. No significant stenosis.

L3-L4: Left eccentric disc bulge. Mild facet and ligament flavum
hypertrophy. Mild left L3 foraminal stenosis.

L4-L5: Circumferential disc bulge with superimposed right
paracentral and slightly caudal disc protrusion (series 10, image
25). Moderate facet hypertrophy greater on the left. Mild to
moderate ligament flavum hypertrophy. Mild to moderate right lateral
recess stenosis (descending right L5 nerve root level). Borderline
to mild left lateral recess stenosis and spinal stenosis. Mild to
moderate bilateral L4 foraminal stenosis, related to disc bulge and
endplate spurring.

L5-S1: Mild circumferential disc bulge. Small superimposed central
disc protrusion best seen on series 6, image 7. Moderate facet
hypertrophy greater on the left. Trace left facet joint fluid. No
spinal stenosis. Mild to moderate left lateral recess stenosis
(descending left S1 nerve root level). Mild bilateral L5 foraminal
stenosis primarily due to endplate and facet spurring.
IMPRESSION: 1. Borderline to mild multifactorial spinal stenosis at L1-L2 and
L4-L5 with small disc herniations at both levels. Focal neural
impingement is most pronounced at the latter in the right lateral
recess at the descending right L5 nerve root level.
2. In terms of left side neural impingement there is up to moderate
multifactorial left lateral recess stenosis at L5-S1, and up to mild
left lateral recess stenosis at L1-L2 and L4-L5. These could affect
the left S1, left L2, and left L5 nerves respectively.

## 2018-04-19 DIAGNOSIS — F5232 Male orgasmic disorder: Secondary | ICD-10-CM | POA: Diagnosis not present

## 2018-04-19 DIAGNOSIS — N200 Calculus of kidney: Secondary | ICD-10-CM | POA: Diagnosis not present

## 2018-04-25 DIAGNOSIS — R42 Dizziness and giddiness: Secondary | ICD-10-CM | POA: Diagnosis not present

## 2018-04-25 DIAGNOSIS — J329 Chronic sinusitis, unspecified: Secondary | ICD-10-CM | POA: Diagnosis not present

## 2018-04-25 DIAGNOSIS — M542 Cervicalgia: Secondary | ICD-10-CM | POA: Diagnosis not present

## 2018-04-25 DIAGNOSIS — R51 Headache: Secondary | ICD-10-CM | POA: Diagnosis not present

## 2018-05-13 DIAGNOSIS — R42 Dizziness and giddiness: Secondary | ICD-10-CM | POA: Diagnosis not present

## 2018-05-17 DIAGNOSIS — R42 Dizziness and giddiness: Secondary | ICD-10-CM | POA: Diagnosis not present

## 2018-05-18 DIAGNOSIS — R42 Dizziness and giddiness: Secondary | ICD-10-CM | POA: Diagnosis not present

## 2018-05-20 DIAGNOSIS — R42 Dizziness and giddiness: Secondary | ICD-10-CM | POA: Diagnosis not present

## 2018-05-20 DIAGNOSIS — R51 Headache: Secondary | ICD-10-CM | POA: Diagnosis not present

## 2018-05-23 DIAGNOSIS — N200 Calculus of kidney: Secondary | ICD-10-CM | POA: Diagnosis not present

## 2018-05-23 DIAGNOSIS — R42 Dizziness and giddiness: Secondary | ICD-10-CM | POA: Diagnosis not present

## 2018-05-25 DIAGNOSIS — R42 Dizziness and giddiness: Secondary | ICD-10-CM | POA: Diagnosis not present

## 2018-06-09 DIAGNOSIS — H8112 Benign paroxysmal vertigo, left ear: Secondary | ICD-10-CM | POA: Diagnosis not present

## 2018-06-09 DIAGNOSIS — M542 Cervicalgia: Secondary | ICD-10-CM | POA: Diagnosis not present

## 2018-06-09 DIAGNOSIS — H9201 Otalgia, right ear: Secondary | ICD-10-CM | POA: Diagnosis not present

## 2018-06-14 DIAGNOSIS — N200 Calculus of kidney: Secondary | ICD-10-CM | POA: Diagnosis not present

## 2018-07-01 DIAGNOSIS — Z1331 Encounter for screening for depression: Secondary | ICD-10-CM | POA: Diagnosis not present

## 2018-07-01 DIAGNOSIS — K219 Gastro-esophageal reflux disease without esophagitis: Secondary | ICD-10-CM | POA: Diagnosis not present

## 2018-07-01 DIAGNOSIS — F430 Acute stress reaction: Secondary | ICD-10-CM | POA: Diagnosis not present

## 2018-07-01 DIAGNOSIS — R51 Headache: Secondary | ICD-10-CM | POA: Diagnosis not present

## 2018-07-01 DIAGNOSIS — R0789 Other chest pain: Secondary | ICD-10-CM | POA: Diagnosis not present

## 2018-07-11 DIAGNOSIS — L400 Psoriasis vulgaris: Secondary | ICD-10-CM | POA: Diagnosis not present

## 2018-07-20 DIAGNOSIS — M79672 Pain in left foot: Secondary | ICD-10-CM | POA: Diagnosis not present

## 2018-07-20 DIAGNOSIS — S93602A Unspecified sprain of left foot, initial encounter: Secondary | ICD-10-CM | POA: Diagnosis not present

## 2018-09-02 DIAGNOSIS — K219 Gastro-esophageal reflux disease without esophagitis: Secondary | ICD-10-CM | POA: Diagnosis not present

## 2018-09-02 DIAGNOSIS — Z86010 Personal history of colonic polyps: Secondary | ICD-10-CM | POA: Diagnosis not present

## 2018-09-02 DIAGNOSIS — R112 Nausea with vomiting, unspecified: Secondary | ICD-10-CM | POA: Diagnosis not present

## 2018-09-02 DIAGNOSIS — K591 Functional diarrhea: Secondary | ICD-10-CM | POA: Diagnosis not present

## 2018-09-05 DIAGNOSIS — L400 Psoriasis vulgaris: Secondary | ICD-10-CM | POA: Diagnosis not present

## 2018-10-14 DIAGNOSIS — M1990 Unspecified osteoarthritis, unspecified site: Secondary | ICD-10-CM | POA: Diagnosis not present

## 2018-10-14 DIAGNOSIS — L409 Psoriasis, unspecified: Secondary | ICD-10-CM | POA: Diagnosis not present

## 2018-10-25 DIAGNOSIS — E781 Pure hyperglyceridemia: Secondary | ICD-10-CM | POA: Diagnosis not present

## 2018-10-25 DIAGNOSIS — N183 Chronic kidney disease, stage 3 (moderate): Secondary | ICD-10-CM | POA: Diagnosis not present

## 2018-10-25 DIAGNOSIS — Z125 Encounter for screening for malignant neoplasm of prostate: Secondary | ICD-10-CM | POA: Diagnosis not present

## 2018-10-26 DIAGNOSIS — R82998 Other abnormal findings in urine: Secondary | ICD-10-CM | POA: Diagnosis not present

## 2018-11-01 DIAGNOSIS — E781 Pure hyperglyceridemia: Secondary | ICD-10-CM | POA: Diagnosis not present

## 2018-11-01 DIAGNOSIS — E669 Obesity, unspecified: Secondary | ICD-10-CM | POA: Diagnosis not present

## 2018-11-01 DIAGNOSIS — Z0000 Encounter for general adult medical examination without abnormal findings: Secondary | ICD-10-CM | POA: Diagnosis not present

## 2018-11-01 DIAGNOSIS — L409 Psoriasis, unspecified: Secondary | ICD-10-CM | POA: Diagnosis not present

## 2018-11-01 DIAGNOSIS — Z23 Encounter for immunization: Secondary | ICD-10-CM | POA: Diagnosis not present

## 2018-11-30 DIAGNOSIS — Z111 Encounter for screening for respiratory tuberculosis: Secondary | ICD-10-CM | POA: Diagnosis not present

## 2018-11-30 DIAGNOSIS — L409 Psoriasis, unspecified: Secondary | ICD-10-CM | POA: Diagnosis not present

## 2018-11-30 DIAGNOSIS — M50323 Other cervical disc degeneration at C6-C7 level: Secondary | ICD-10-CM | POA: Diagnosis not present

## 2018-11-30 DIAGNOSIS — L4050 Arthropathic psoriasis, unspecified: Secondary | ICD-10-CM | POA: Diagnosis not present

## 2018-11-30 DIAGNOSIS — M542 Cervicalgia: Secondary | ICD-10-CM | POA: Diagnosis not present

## 2018-11-30 DIAGNOSIS — G8929 Other chronic pain: Secondary | ICD-10-CM | POA: Diagnosis not present

## 2018-11-30 DIAGNOSIS — M47812 Spondylosis without myelopathy or radiculopathy, cervical region: Secondary | ICD-10-CM | POA: Diagnosis not present

## 2018-11-30 DIAGNOSIS — Z79899 Other long term (current) drug therapy: Secondary | ICD-10-CM | POA: Diagnosis not present

## 2018-12-13 DIAGNOSIS — N200 Calculus of kidney: Secondary | ICD-10-CM | POA: Diagnosis not present

## 2018-12-19 DIAGNOSIS — J329 Chronic sinusitis, unspecified: Secondary | ICD-10-CM | POA: Diagnosis not present

## 2018-12-19 DIAGNOSIS — R51 Headache: Secondary | ICD-10-CM | POA: Diagnosis not present

## 2019-01-05 DIAGNOSIS — L820 Inflamed seborrheic keratosis: Secondary | ICD-10-CM | POA: Diagnosis not present

## 2019-01-05 DIAGNOSIS — D485 Neoplasm of uncertain behavior of skin: Secondary | ICD-10-CM | POA: Diagnosis not present

## 2019-01-05 DIAGNOSIS — L400 Psoriasis vulgaris: Secondary | ICD-10-CM | POA: Diagnosis not present

## 2019-01-31 DIAGNOSIS — L4050 Arthropathic psoriasis, unspecified: Secondary | ICD-10-CM | POA: Diagnosis not present

## 2019-01-31 DIAGNOSIS — L400 Psoriasis vulgaris: Secondary | ICD-10-CM | POA: Diagnosis not present

## 2019-01-31 DIAGNOSIS — Z79899 Other long term (current) drug therapy: Secondary | ICD-10-CM | POA: Diagnosis not present

## 2019-02-10 DIAGNOSIS — R1013 Epigastric pain: Secondary | ICD-10-CM | POA: Diagnosis not present

## 2019-02-10 DIAGNOSIS — K219 Gastro-esophageal reflux disease without esophagitis: Secondary | ICD-10-CM | POA: Diagnosis not present

## 2019-02-10 DIAGNOSIS — R0789 Other chest pain: Secondary | ICD-10-CM | POA: Diagnosis not present

## 2019-02-17 DIAGNOSIS — R1013 Epigastric pain: Secondary | ICD-10-CM | POA: Diagnosis not present

## 2019-02-17 DIAGNOSIS — K219 Gastro-esophageal reflux disease without esophagitis: Secondary | ICD-10-CM | POA: Diagnosis not present

## 2019-03-03 DIAGNOSIS — Z1159 Encounter for screening for other viral diseases: Secondary | ICD-10-CM | POA: Diagnosis not present

## 2019-03-08 DIAGNOSIS — R079 Chest pain, unspecified: Secondary | ICD-10-CM | POA: Diagnosis not present

## 2019-03-08 DIAGNOSIS — K2930 Chronic superficial gastritis without bleeding: Secondary | ICD-10-CM | POA: Diagnosis not present

## 2019-03-08 DIAGNOSIS — R12 Heartburn: Secondary | ICD-10-CM | POA: Diagnosis not present

## 2019-03-08 DIAGNOSIS — K317 Polyp of stomach and duodenum: Secondary | ICD-10-CM | POA: Diagnosis not present

## 2019-03-08 DIAGNOSIS — K219 Gastro-esophageal reflux disease without esophagitis: Secondary | ICD-10-CM | POA: Diagnosis not present

## 2019-03-10 DIAGNOSIS — K317 Polyp of stomach and duodenum: Secondary | ICD-10-CM | POA: Diagnosis not present

## 2019-03-10 DIAGNOSIS — K219 Gastro-esophageal reflux disease without esophagitis: Secondary | ICD-10-CM | POA: Diagnosis not present

## 2019-03-10 DIAGNOSIS — K2930 Chronic superficial gastritis without bleeding: Secondary | ICD-10-CM | POA: Diagnosis not present

## 2019-03-31 DIAGNOSIS — R1114 Bilious vomiting: Secondary | ICD-10-CM | POA: Diagnosis not present

## 2019-03-31 DIAGNOSIS — R0789 Other chest pain: Secondary | ICD-10-CM | POA: Diagnosis not present

## 2019-03-31 DIAGNOSIS — R1013 Epigastric pain: Secondary | ICD-10-CM | POA: Diagnosis not present

## 2019-04-07 DIAGNOSIS — R109 Unspecified abdominal pain: Secondary | ICD-10-CM | POA: Diagnosis not present

## 2019-04-07 DIAGNOSIS — G4700 Insomnia, unspecified: Secondary | ICD-10-CM | POA: Diagnosis not present

## 2019-04-07 DIAGNOSIS — R112 Nausea with vomiting, unspecified: Secondary | ICD-10-CM | POA: Diagnosis not present

## 2019-04-07 DIAGNOSIS — R1013 Epigastric pain: Secondary | ICD-10-CM | POA: Diagnosis not present

## 2019-05-13 DIAGNOSIS — Z20828 Contact with and (suspected) exposure to other viral communicable diseases: Secondary | ICD-10-CM | POA: Diagnosis not present

## 2019-10-25 ENCOUNTER — Other Ambulatory Visit: Payer: Self-pay

## 2020-04-29 DIAGNOSIS — L4050 Arthropathic psoriasis, unspecified: Secondary | ICD-10-CM | POA: Diagnosis not present

## 2020-05-02 DIAGNOSIS — L4050 Arthropathic psoriasis, unspecified: Secondary | ICD-10-CM | POA: Diagnosis not present

## 2020-05-02 DIAGNOSIS — L400 Psoriasis vulgaris: Secondary | ICD-10-CM | POA: Diagnosis not present

## 2020-05-02 DIAGNOSIS — Z79899 Other long term (current) drug therapy: Secondary | ICD-10-CM | POA: Diagnosis not present

## 2020-05-16 DIAGNOSIS — R0789 Other chest pain: Secondary | ICD-10-CM | POA: Diagnosis not present

## 2020-05-16 DIAGNOSIS — F4321 Adjustment disorder with depressed mood: Secondary | ICD-10-CM | POA: Diagnosis not present

## 2020-05-16 DIAGNOSIS — K219 Gastro-esophageal reflux disease without esophagitis: Secondary | ICD-10-CM | POA: Diagnosis not present

## 2020-05-16 DIAGNOSIS — G4700 Insomnia, unspecified: Secondary | ICD-10-CM | POA: Diagnosis not present

## 2020-05-22 DIAGNOSIS — Z23 Encounter for immunization: Secondary | ICD-10-CM | POA: Diagnosis not present

## 2020-05-23 DIAGNOSIS — F4321 Adjustment disorder with depressed mood: Secondary | ICD-10-CM | POA: Diagnosis not present

## 2020-05-23 DIAGNOSIS — E781 Pure hyperglyceridemia: Secondary | ICD-10-CM | POA: Diagnosis not present

## 2020-05-23 DIAGNOSIS — K219 Gastro-esophageal reflux disease without esophagitis: Secondary | ICD-10-CM | POA: Diagnosis not present

## 2020-05-23 DIAGNOSIS — M1990 Unspecified osteoarthritis, unspecified site: Secondary | ICD-10-CM | POA: Diagnosis not present

## 2020-05-23 DIAGNOSIS — H43391 Other vitreous opacities, right eye: Secondary | ICD-10-CM | POA: Diagnosis not present

## 2020-05-23 DIAGNOSIS — L409 Psoriasis, unspecified: Secondary | ICD-10-CM | POA: Diagnosis not present

## 2020-05-23 DIAGNOSIS — R413 Other amnesia: Secondary | ICD-10-CM | POA: Diagnosis not present

## 2020-05-23 DIAGNOSIS — E669 Obesity, unspecified: Secondary | ICD-10-CM | POA: Diagnosis not present

## 2020-05-23 DIAGNOSIS — G4700 Insomnia, unspecified: Secondary | ICD-10-CM | POA: Diagnosis not present

## 2020-05-23 DIAGNOSIS — N183 Chronic kidney disease, stage 3 unspecified: Secondary | ICD-10-CM | POA: Diagnosis not present

## 2020-05-27 DIAGNOSIS — L4050 Arthropathic psoriasis, unspecified: Secondary | ICD-10-CM | POA: Diagnosis not present

## 2020-06-07 DIAGNOSIS — H52223 Regular astigmatism, bilateral: Secondary | ICD-10-CM | POA: Diagnosis not present

## 2020-06-07 DIAGNOSIS — H5213 Myopia, bilateral: Secondary | ICD-10-CM | POA: Diagnosis not present

## 2020-06-07 DIAGNOSIS — H2513 Age-related nuclear cataract, bilateral: Secondary | ICD-10-CM | POA: Diagnosis not present

## 2020-06-07 DIAGNOSIS — H35372 Puckering of macula, left eye: Secondary | ICD-10-CM | POA: Diagnosis not present

## 2020-06-07 DIAGNOSIS — H524 Presbyopia: Secondary | ICD-10-CM | POA: Diagnosis not present

## 2020-06-24 DIAGNOSIS — L4050 Arthropathic psoriasis, unspecified: Secondary | ICD-10-CM | POA: Diagnosis not present

## 2020-07-22 DIAGNOSIS — L4050 Arthropathic psoriasis, unspecified: Secondary | ICD-10-CM | POA: Diagnosis not present

## 2020-08-19 DIAGNOSIS — L4050 Arthropathic psoriasis, unspecified: Secondary | ICD-10-CM | POA: Diagnosis not present

## 2020-08-30 DIAGNOSIS — J3489 Other specified disorders of nose and nasal sinuses: Secondary | ICD-10-CM | POA: Diagnosis not present

## 2020-08-30 DIAGNOSIS — J0100 Acute maxillary sinusitis, unspecified: Secondary | ICD-10-CM | POA: Diagnosis not present

## 2020-09-05 DIAGNOSIS — L4050 Arthropathic psoriasis, unspecified: Secondary | ICD-10-CM | POA: Diagnosis not present

## 2020-09-05 DIAGNOSIS — L400 Psoriasis vulgaris: Secondary | ICD-10-CM | POA: Diagnosis not present

## 2020-09-05 DIAGNOSIS — Z79899 Other long term (current) drug therapy: Secondary | ICD-10-CM | POA: Diagnosis not present

## 2020-09-23 DIAGNOSIS — L4050 Arthropathic psoriasis, unspecified: Secondary | ICD-10-CM | POA: Diagnosis not present

## 2020-09-24 DIAGNOSIS — F4321 Adjustment disorder with depressed mood: Secondary | ICD-10-CM | POA: Diagnosis not present

## 2020-09-24 DIAGNOSIS — L409 Psoriasis, unspecified: Secondary | ICD-10-CM | POA: Diagnosis not present

## 2020-09-24 DIAGNOSIS — N183 Chronic kidney disease, stage 3 unspecified: Secondary | ICD-10-CM | POA: Diagnosis not present

## 2020-09-24 DIAGNOSIS — H43391 Other vitreous opacities, right eye: Secondary | ICD-10-CM | POA: Diagnosis not present

## 2020-09-24 DIAGNOSIS — K219 Gastro-esophageal reflux disease without esophagitis: Secondary | ICD-10-CM | POA: Diagnosis not present

## 2020-09-24 DIAGNOSIS — G4700 Insomnia, unspecified: Secondary | ICD-10-CM | POA: Diagnosis not present

## 2020-09-24 DIAGNOSIS — E669 Obesity, unspecified: Secondary | ICD-10-CM | POA: Diagnosis not present

## 2020-09-24 DIAGNOSIS — R413 Other amnesia: Secondary | ICD-10-CM | POA: Diagnosis not present

## 2020-09-24 DIAGNOSIS — E781 Pure hyperglyceridemia: Secondary | ICD-10-CM | POA: Diagnosis not present

## 2020-09-24 DIAGNOSIS — L237 Allergic contact dermatitis due to plants, except food: Secondary | ICD-10-CM | POA: Diagnosis not present

## 2020-09-30 DIAGNOSIS — L237 Allergic contact dermatitis due to plants, except food: Secondary | ICD-10-CM | POA: Diagnosis not present

## 2020-10-28 DIAGNOSIS — L4050 Arthropathic psoriasis, unspecified: Secondary | ICD-10-CM | POA: Diagnosis not present

## 2020-11-01 DIAGNOSIS — Z1152 Encounter for screening for COVID-19: Secondary | ICD-10-CM | POA: Diagnosis not present

## 2020-11-01 DIAGNOSIS — J0100 Acute maxillary sinusitis, unspecified: Secondary | ICD-10-CM | POA: Diagnosis not present

## 2020-11-01 DIAGNOSIS — J3489 Other specified disorders of nose and nasal sinuses: Secondary | ICD-10-CM | POA: Diagnosis not present

## 2020-12-09 DIAGNOSIS — L4050 Arthropathic psoriasis, unspecified: Secondary | ICD-10-CM | POA: Diagnosis not present

## 2020-12-12 DIAGNOSIS — Z125 Encounter for screening for malignant neoplasm of prostate: Secondary | ICD-10-CM | POA: Diagnosis not present

## 2020-12-12 DIAGNOSIS — E781 Pure hyperglyceridemia: Secondary | ICD-10-CM | POA: Diagnosis not present

## 2020-12-19 DIAGNOSIS — Z0000 Encounter for general adult medical examination without abnormal findings: Secondary | ICD-10-CM | POA: Diagnosis not present

## 2020-12-19 DIAGNOSIS — R413 Other amnesia: Secondary | ICD-10-CM | POA: Diagnosis not present

## 2020-12-19 DIAGNOSIS — Z1212 Encounter for screening for malignant neoplasm of rectum: Secondary | ICD-10-CM | POA: Diagnosis not present

## 2020-12-19 DIAGNOSIS — K219 Gastro-esophageal reflux disease without esophagitis: Secondary | ICD-10-CM | POA: Diagnosis not present

## 2020-12-19 DIAGNOSIS — E669 Obesity, unspecified: Secondary | ICD-10-CM | POA: Diagnosis not present

## 2020-12-19 DIAGNOSIS — E781 Pure hyperglyceridemia: Secondary | ICD-10-CM | POA: Diagnosis not present

## 2020-12-19 DIAGNOSIS — R82998 Other abnormal findings in urine: Secondary | ICD-10-CM | POA: Diagnosis not present

## 2020-12-19 DIAGNOSIS — L409 Psoriasis, unspecified: Secondary | ICD-10-CM | POA: Diagnosis not present

## 2020-12-19 DIAGNOSIS — N183 Chronic kidney disease, stage 3 unspecified: Secondary | ICD-10-CM | POA: Diagnosis not present

## 2020-12-19 DIAGNOSIS — G250 Essential tremor: Secondary | ICD-10-CM | POA: Diagnosis not present

## 2020-12-19 DIAGNOSIS — G4700 Insomnia, unspecified: Secondary | ICD-10-CM | POA: Diagnosis not present

## 2020-12-19 DIAGNOSIS — H43391 Other vitreous opacities, right eye: Secondary | ICD-10-CM | POA: Diagnosis not present

## 2020-12-19 DIAGNOSIS — F4321 Adjustment disorder with depressed mood: Secondary | ICD-10-CM | POA: Diagnosis not present

## 2020-12-26 DIAGNOSIS — K219 Gastro-esophageal reflux disease without esophagitis: Secondary | ICD-10-CM | POA: Diagnosis not present

## 2021-01-06 DIAGNOSIS — L4050 Arthropathic psoriasis, unspecified: Secondary | ICD-10-CM | POA: Diagnosis not present

## 2021-01-16 ENCOUNTER — Telehealth: Payer: Self-pay | Admitting: Gastroenterology

## 2021-01-16 NOTE — Telephone Encounter (Signed)
Attempted to contact pt but vm not set up.  Pt dropped his records from [facility] at the [facility]. He wants to transfer care to Dr. Tarri Glenn from Dr. Cristina Gong. Need to know reason for transfer.

## 2021-01-18 DIAGNOSIS — Z23 Encounter for immunization: Secondary | ICD-10-CM | POA: Diagnosis not present

## 2021-01-27 NOTE — Telephone Encounter (Signed)
Awaiting those records for review.  

## 2021-01-27 NOTE — Telephone Encounter (Signed)
Hey Dr. Tarri Glenn,   Inbound call from patient requesting a transfer of care from [facility] because his GI provider is retired. Patient is needing to be seen for vomit attacks where the pain feel like have an heart attack. He would like you as his GI provider. I have the records and will send for review. Could you please review and advise on scheduling?  Thank you

## 2021-02-03 DIAGNOSIS — L4050 Arthropathic psoriasis, unspecified: Secondary | ICD-10-CM | POA: Diagnosis not present

## 2021-02-03 NOTE — Telephone Encounter (Signed)
Inbound call from patient. States can disregard his request for a transfer of care he will be going to another GI provider.   Thank you

## 2021-02-03 NOTE — Telephone Encounter (Signed)
Noted. Thanks.

## 2021-02-05 DIAGNOSIS — L400 Psoriasis vulgaris: Secondary | ICD-10-CM | POA: Diagnosis not present

## 2021-02-05 DIAGNOSIS — Z79899 Other long term (current) drug therapy: Secondary | ICD-10-CM | POA: Diagnosis not present

## 2021-02-05 DIAGNOSIS — L4050 Arthropathic psoriasis, unspecified: Secondary | ICD-10-CM | POA: Diagnosis not present

## 2021-02-11 DIAGNOSIS — K219 Gastro-esophageal reflux disease without esophagitis: Secondary | ICD-10-CM | POA: Diagnosis not present

## 2021-02-11 DIAGNOSIS — R1115 Cyclical vomiting syndrome unrelated to migraine: Secondary | ICD-10-CM | POA: Diagnosis not present

## 2021-03-10 DIAGNOSIS — L4050 Arthropathic psoriasis, unspecified: Secondary | ICD-10-CM | POA: Diagnosis not present

## 2021-03-10 DIAGNOSIS — Z7969 Long term (current) use of other immunomodulators and immunosuppressants: Secondary | ICD-10-CM | POA: Diagnosis not present

## 2021-03-11 DIAGNOSIS — H524 Presbyopia: Secondary | ICD-10-CM | POA: Diagnosis not present

## 2021-03-11 DIAGNOSIS — H5213 Myopia, bilateral: Secondary | ICD-10-CM | POA: Diagnosis not present

## 2021-03-11 DIAGNOSIS — H35372 Puckering of macula, left eye: Secondary | ICD-10-CM | POA: Diagnosis not present

## 2021-03-11 DIAGNOSIS — H52223 Regular astigmatism, bilateral: Secondary | ICD-10-CM | POA: Diagnosis not present

## 2021-03-11 DIAGNOSIS — H2513 Age-related nuclear cataract, bilateral: Secondary | ICD-10-CM | POA: Diagnosis not present

## 2021-03-28 DIAGNOSIS — G250 Essential tremor: Secondary | ICD-10-CM | POA: Diagnosis not present

## 2021-03-28 DIAGNOSIS — K219 Gastro-esophageal reflux disease without esophagitis: Secondary | ICD-10-CM | POA: Diagnosis not present

## 2021-03-28 DIAGNOSIS — E781 Pure hyperglyceridemia: Secondary | ICD-10-CM | POA: Diagnosis not present

## 2021-03-28 DIAGNOSIS — H43391 Other vitreous opacities, right eye: Secondary | ICD-10-CM | POA: Diagnosis not present

## 2021-03-28 DIAGNOSIS — R413 Other amnesia: Secondary | ICD-10-CM | POA: Diagnosis not present

## 2021-03-28 DIAGNOSIS — M1990 Unspecified osteoarthritis, unspecified site: Secondary | ICD-10-CM | POA: Diagnosis not present

## 2021-03-28 DIAGNOSIS — N183 Chronic kidney disease, stage 3 unspecified: Secondary | ICD-10-CM | POA: Diagnosis not present

## 2021-03-28 DIAGNOSIS — L409 Psoriasis, unspecified: Secondary | ICD-10-CM | POA: Diagnosis not present

## 2021-03-28 DIAGNOSIS — E669 Obesity, unspecified: Secondary | ICD-10-CM | POA: Diagnosis not present

## 2021-03-31 DIAGNOSIS — Z79899 Other long term (current) drug therapy: Secondary | ICD-10-CM | POA: Diagnosis not present

## 2021-03-31 DIAGNOSIS — E781 Pure hyperglyceridemia: Secondary | ICD-10-CM | POA: Diagnosis not present

## 2021-04-14 DIAGNOSIS — L4050 Arthropathic psoriasis, unspecified: Secondary | ICD-10-CM | POA: Diagnosis not present

## 2021-04-15 DIAGNOSIS — L821 Other seborrheic keratosis: Secondary | ICD-10-CM | POA: Diagnosis not present

## 2021-04-15 DIAGNOSIS — D225 Melanocytic nevi of trunk: Secondary | ICD-10-CM | POA: Diagnosis not present

## 2021-04-15 DIAGNOSIS — B078 Other viral warts: Secondary | ICD-10-CM | POA: Diagnosis not present

## 2021-04-15 DIAGNOSIS — D485 Neoplasm of uncertain behavior of skin: Secondary | ICD-10-CM | POA: Diagnosis not present

## 2021-04-15 DIAGNOSIS — L905 Scar conditions and fibrosis of skin: Secondary | ICD-10-CM | POA: Diagnosis not present

## 2021-05-02 DIAGNOSIS — L988 Other specified disorders of the skin and subcutaneous tissue: Secondary | ICD-10-CM | POA: Diagnosis not present

## 2021-05-02 DIAGNOSIS — D485 Neoplasm of uncertain behavior of skin: Secondary | ICD-10-CM | POA: Diagnosis not present

## 2021-05-13 DIAGNOSIS — H18413 Arcus senilis, bilateral: Secondary | ICD-10-CM | POA: Diagnosis not present

## 2021-05-13 DIAGNOSIS — H35373 Puckering of macula, bilateral: Secondary | ICD-10-CM | POA: Diagnosis not present

## 2021-05-13 DIAGNOSIS — H40013 Open angle with borderline findings, low risk, bilateral: Secondary | ICD-10-CM | POA: Diagnosis not present

## 2021-05-13 DIAGNOSIS — H2512 Age-related nuclear cataract, left eye: Secondary | ICD-10-CM | POA: Diagnosis not present

## 2021-05-13 DIAGNOSIS — H2513 Age-related nuclear cataract, bilateral: Secondary | ICD-10-CM | POA: Diagnosis not present

## 2021-05-16 DIAGNOSIS — R1115 Cyclical vomiting syndrome unrelated to migraine: Secondary | ICD-10-CM | POA: Diagnosis not present

## 2021-05-16 DIAGNOSIS — K219 Gastro-esophageal reflux disease without esophagitis: Secondary | ICD-10-CM | POA: Diagnosis not present

## 2021-05-19 DIAGNOSIS — L4050 Arthropathic psoriasis, unspecified: Secondary | ICD-10-CM | POA: Diagnosis not present

## 2021-06-20 DIAGNOSIS — L4050 Arthropathic psoriasis, unspecified: Secondary | ICD-10-CM | POA: Diagnosis not present

## 2021-06-20 DIAGNOSIS — Z7969 Long term (current) use of other immunomodulators and immunosuppressants: Secondary | ICD-10-CM | POA: Diagnosis not present

## 2021-07-18 DIAGNOSIS — L4050 Arthropathic psoriasis, unspecified: Secondary | ICD-10-CM | POA: Diagnosis not present

## 2021-07-18 DIAGNOSIS — Z7969 Long term (current) use of other immunomodulators and immunosuppressants: Secondary | ICD-10-CM | POA: Diagnosis not present

## 2021-07-29 DIAGNOSIS — D485 Neoplasm of uncertain behavior of skin: Secondary | ICD-10-CM | POA: Diagnosis not present

## 2021-07-29 DIAGNOSIS — D2271 Melanocytic nevi of right lower limb, including hip: Secondary | ICD-10-CM | POA: Diagnosis not present

## 2021-07-29 DIAGNOSIS — D225 Melanocytic nevi of trunk: Secondary | ICD-10-CM | POA: Diagnosis not present

## 2021-07-29 DIAGNOSIS — Z1283 Encounter for screening for malignant neoplasm of skin: Secondary | ICD-10-CM | POA: Diagnosis not present

## 2021-08-04 DIAGNOSIS — H2512 Age-related nuclear cataract, left eye: Secondary | ICD-10-CM | POA: Diagnosis not present

## 2021-08-04 DIAGNOSIS — H52202 Unspecified astigmatism, left eye: Secondary | ICD-10-CM | POA: Diagnosis not present

## 2021-08-05 DIAGNOSIS — H2511 Age-related nuclear cataract, right eye: Secondary | ICD-10-CM | POA: Diagnosis not present

## 2021-08-15 DIAGNOSIS — L4050 Arthropathic psoriasis, unspecified: Secondary | ICD-10-CM | POA: Diagnosis not present

## 2021-08-25 DIAGNOSIS — H52201 Unspecified astigmatism, right eye: Secondary | ICD-10-CM | POA: Diagnosis not present

## 2021-08-25 DIAGNOSIS — H2511 Age-related nuclear cataract, right eye: Secondary | ICD-10-CM | POA: Diagnosis not present

## 2021-09-09 DIAGNOSIS — D485 Neoplasm of uncertain behavior of skin: Secondary | ICD-10-CM | POA: Diagnosis not present

## 2021-09-09 DIAGNOSIS — D225 Melanocytic nevi of trunk: Secondary | ICD-10-CM | POA: Diagnosis not present

## 2021-09-10 DIAGNOSIS — L4050 Arthropathic psoriasis, unspecified: Secondary | ICD-10-CM | POA: Diagnosis not present

## 2021-09-10 DIAGNOSIS — L400 Psoriasis vulgaris: Secondary | ICD-10-CM | POA: Diagnosis not present

## 2021-09-10 DIAGNOSIS — M170 Bilateral primary osteoarthritis of knee: Secondary | ICD-10-CM | POA: Diagnosis not present

## 2021-09-10 DIAGNOSIS — Z79899 Other long term (current) drug therapy: Secondary | ICD-10-CM | POA: Diagnosis not present

## 2021-09-12 DIAGNOSIS — R030 Elevated blood-pressure reading, without diagnosis of hypertension: Secondary | ICD-10-CM | POA: Diagnosis not present

## 2021-09-12 DIAGNOSIS — L4050 Arthropathic psoriasis, unspecified: Secondary | ICD-10-CM | POA: Diagnosis not present

## 2021-09-12 DIAGNOSIS — R635 Abnormal weight gain: Secondary | ICD-10-CM | POA: Diagnosis not present

## 2021-09-12 DIAGNOSIS — E669 Obesity, unspecified: Secondary | ICD-10-CM | POA: Diagnosis not present

## 2021-09-12 DIAGNOSIS — N183 Chronic kidney disease, stage 3 unspecified: Secondary | ICD-10-CM | POA: Diagnosis not present

## 2021-10-09 DIAGNOSIS — L4050 Arthropathic psoriasis, unspecified: Secondary | ICD-10-CM | POA: Diagnosis not present

## 2021-10-13 DIAGNOSIS — N183 Chronic kidney disease, stage 3 unspecified: Secondary | ICD-10-CM | POA: Diagnosis not present

## 2021-10-13 DIAGNOSIS — E669 Obesity, unspecified: Secondary | ICD-10-CM | POA: Diagnosis not present

## 2021-10-13 DIAGNOSIS — R030 Elevated blood-pressure reading, without diagnosis of hypertension: Secondary | ICD-10-CM | POA: Diagnosis not present

## 2021-11-06 DIAGNOSIS — L4050 Arthropathic psoriasis, unspecified: Secondary | ICD-10-CM | POA: Diagnosis not present

## 2021-11-28 DIAGNOSIS — R2231 Localized swelling, mass and lump, right upper limb: Secondary | ICD-10-CM | POA: Diagnosis not present

## 2021-11-28 DIAGNOSIS — T63461A Toxic effect of venom of wasps, accidental (unintentional), initial encounter: Secondary | ICD-10-CM | POA: Diagnosis not present

## 2021-12-05 DIAGNOSIS — L4050 Arthropathic psoriasis, unspecified: Secondary | ICD-10-CM | POA: Diagnosis not present

## 2021-12-16 DIAGNOSIS — H53143 Visual discomfort, bilateral: Secondary | ICD-10-CM | POA: Diagnosis not present

## 2021-12-16 DIAGNOSIS — H524 Presbyopia: Secondary | ICD-10-CM | POA: Diagnosis not present

## 2021-12-16 DIAGNOSIS — H35373 Puckering of macula, bilateral: Secondary | ICD-10-CM | POA: Diagnosis not present

## 2021-12-16 DIAGNOSIS — H52223 Regular astigmatism, bilateral: Secondary | ICD-10-CM | POA: Diagnosis not present

## 2021-12-16 DIAGNOSIS — Z9849 Cataract extraction status, unspecified eye: Secondary | ICD-10-CM | POA: Diagnosis not present

## 2022-01-09 DIAGNOSIS — L4050 Arthropathic psoriasis, unspecified: Secondary | ICD-10-CM | POA: Diagnosis not present

## 2022-01-20 DIAGNOSIS — Z1212 Encounter for screening for malignant neoplasm of rectum: Secondary | ICD-10-CM | POA: Diagnosis not present

## 2022-01-20 DIAGNOSIS — E781 Pure hyperglyceridemia: Secondary | ICD-10-CM | POA: Diagnosis not present

## 2022-01-20 DIAGNOSIS — Z125 Encounter for screening for malignant neoplasm of prostate: Secondary | ICD-10-CM | POA: Diagnosis not present

## 2022-01-20 DIAGNOSIS — Z79899 Other long term (current) drug therapy: Secondary | ICD-10-CM | POA: Diagnosis not present

## 2022-01-20 DIAGNOSIS — R7989 Other specified abnormal findings of blood chemistry: Secondary | ICD-10-CM | POA: Diagnosis not present

## 2022-01-27 DIAGNOSIS — G4700 Insomnia, unspecified: Secondary | ICD-10-CM | POA: Diagnosis not present

## 2022-01-27 DIAGNOSIS — Z23 Encounter for immunization: Secondary | ICD-10-CM | POA: Diagnosis not present

## 2022-01-27 DIAGNOSIS — L409 Psoriasis, unspecified: Secondary | ICD-10-CM | POA: Diagnosis not present

## 2022-01-27 DIAGNOSIS — N183 Chronic kidney disease, stage 3 unspecified: Secondary | ICD-10-CM | POA: Diagnosis not present

## 2022-01-27 DIAGNOSIS — Z0000 Encounter for general adult medical examination without abnormal findings: Secondary | ICD-10-CM | POA: Diagnosis not present

## 2022-01-27 DIAGNOSIS — K219 Gastro-esophageal reflux disease without esophagitis: Secondary | ICD-10-CM | POA: Diagnosis not present

## 2022-01-27 DIAGNOSIS — G250 Essential tremor: Secondary | ICD-10-CM | POA: Diagnosis not present

## 2022-01-27 DIAGNOSIS — E669 Obesity, unspecified: Secondary | ICD-10-CM | POA: Diagnosis not present

## 2022-01-27 DIAGNOSIS — R0789 Other chest pain: Secondary | ICD-10-CM | POA: Diagnosis not present

## 2022-01-27 DIAGNOSIS — R82998 Other abnormal findings in urine: Secondary | ICD-10-CM | POA: Diagnosis not present

## 2022-01-27 DIAGNOSIS — R413 Other amnesia: Secondary | ICD-10-CM | POA: Diagnosis not present

## 2022-01-27 DIAGNOSIS — R5383 Other fatigue: Secondary | ICD-10-CM | POA: Diagnosis not present

## 2022-01-27 DIAGNOSIS — E781 Pure hyperglyceridemia: Secondary | ICD-10-CM | POA: Diagnosis not present

## 2022-02-09 DIAGNOSIS — L4050 Arthropathic psoriasis, unspecified: Secondary | ICD-10-CM | POA: Diagnosis not present

## 2022-03-13 DIAGNOSIS — L4050 Arthropathic psoriasis, unspecified: Secondary | ICD-10-CM | POA: Diagnosis not present

## 2022-03-16 DIAGNOSIS — J329 Chronic sinusitis, unspecified: Secondary | ICD-10-CM | POA: Diagnosis not present

## 2022-03-16 DIAGNOSIS — H9201 Otalgia, right ear: Secondary | ICD-10-CM | POA: Diagnosis not present

## 2022-03-18 DIAGNOSIS — M170 Bilateral primary osteoarthritis of knee: Secondary | ICD-10-CM | POA: Diagnosis not present

## 2022-03-18 DIAGNOSIS — L400 Psoriasis vulgaris: Secondary | ICD-10-CM | POA: Diagnosis not present

## 2022-03-18 DIAGNOSIS — L4050 Arthropathic psoriasis, unspecified: Secondary | ICD-10-CM | POA: Diagnosis not present

## 2022-03-18 DIAGNOSIS — Z79899 Other long term (current) drug therapy: Secondary | ICD-10-CM | POA: Diagnosis not present

## 2022-03-25 DIAGNOSIS — H903 Sensorineural hearing loss, bilateral: Secondary | ICD-10-CM | POA: Diagnosis not present

## 2022-03-25 DIAGNOSIS — J302 Other seasonal allergic rhinitis: Secondary | ICD-10-CM | POA: Diagnosis not present

## 2022-03-25 DIAGNOSIS — H9042 Sensorineural hearing loss, unilateral, left ear, with unrestricted hearing on the contralateral side: Secondary | ICD-10-CM | POA: Diagnosis not present

## 2022-03-25 DIAGNOSIS — H6983 Other specified disorders of Eustachian tube, bilateral: Secondary | ICD-10-CM | POA: Diagnosis not present

## 2022-04-15 DIAGNOSIS — H9313 Tinnitus, bilateral: Secondary | ICD-10-CM | POA: Diagnosis not present

## 2022-04-15 DIAGNOSIS — J302 Other seasonal allergic rhinitis: Secondary | ICD-10-CM | POA: Diagnosis not present

## 2022-04-15 DIAGNOSIS — R42 Dizziness and giddiness: Secondary | ICD-10-CM | POA: Diagnosis not present

## 2022-04-15 DIAGNOSIS — H6983 Other specified disorders of Eustachian tube, bilateral: Secondary | ICD-10-CM | POA: Diagnosis not present

## 2022-04-23 DIAGNOSIS — L4050 Arthropathic psoriasis, unspecified: Secondary | ICD-10-CM | POA: Diagnosis not present

## 2022-05-21 DIAGNOSIS — L4050 Arthropathic psoriasis, unspecified: Secondary | ICD-10-CM | POA: Diagnosis not present

## 2022-06-05 DIAGNOSIS — K219 Gastro-esophageal reflux disease without esophagitis: Secondary | ICD-10-CM | POA: Diagnosis not present

## 2022-06-05 DIAGNOSIS — R1115 Cyclical vomiting syndrome unrelated to migraine: Secondary | ICD-10-CM | POA: Diagnosis not present

## 2022-06-19 ENCOUNTER — Other Ambulatory Visit (HOSPITAL_COMMUNITY): Payer: Self-pay | Admitting: Adult Health

## 2022-06-19 ENCOUNTER — Ambulatory Visit (HOSPITAL_BASED_OUTPATIENT_CLINIC_OR_DEPARTMENT_OTHER)
Admission: RE | Admit: 2022-06-19 | Discharge: 2022-06-19 | Disposition: A | Discharge status: Home / Self Care | Payer: Medicare Other | Source: Ambulatory Visit | Attending: Adult Health | Admitting: Adult Health

## 2022-06-19 DIAGNOSIS — Z87442 Personal history of urinary calculi: Secondary | ICD-10-CM | POA: Insufficient documentation

## 2022-06-19 DIAGNOSIS — R109 Unspecified abdominal pain: Secondary | ICD-10-CM | POA: Diagnosis not present

## 2022-06-19 DIAGNOSIS — M5136 Other intervertebral disc degeneration, lumbar region: Secondary | ICD-10-CM | POA: Diagnosis not present

## 2022-06-19 DIAGNOSIS — N1831 Chronic kidney disease, stage 3a: Secondary | ICD-10-CM | POA: Diagnosis not present

## 2022-06-19 DIAGNOSIS — K5730 Diverticulosis of large intestine without perforation or abscess without bleeding: Secondary | ICD-10-CM | POA: Diagnosis not present

## 2022-06-19 DIAGNOSIS — N200 Calculus of kidney: Secondary | ICD-10-CM | POA: Diagnosis not present

## 2022-06-25 ENCOUNTER — Other Ambulatory Visit (HOSPITAL_BASED_OUTPATIENT_CLINIC_OR_DEPARTMENT_OTHER): Payer: Self-pay | Admitting: Internal Medicine

## 2022-06-25 DIAGNOSIS — R0602 Shortness of breath: Secondary | ICD-10-CM

## 2022-06-25 DIAGNOSIS — R918 Other nonspecific abnormal finding of lung field: Secondary | ICD-10-CM

## 2022-06-29 DIAGNOSIS — Z79620 Long term (current) use of immunosuppressive biologic: Secondary | ICD-10-CM | POA: Diagnosis not present

## 2022-06-29 DIAGNOSIS — L4050 Arthropathic psoriasis, unspecified: Secondary | ICD-10-CM | POA: Diagnosis not present

## 2022-07-07 ENCOUNTER — Ambulatory Visit
Admission: RE | Admit: 2022-07-07 | Discharge: 2022-07-07 | Disposition: A | Discharge status: Home / Self Care | Payer: Medicare Other | Source: Ambulatory Visit | Attending: Internal Medicine | Admitting: Internal Medicine

## 2022-07-07 DIAGNOSIS — R0602 Shortness of breath: Secondary | ICD-10-CM | POA: Insufficient documentation

## 2022-07-07 DIAGNOSIS — J479 Bronchiectasis, uncomplicated: Secondary | ICD-10-CM | POA: Diagnosis not present

## 2022-07-07 DIAGNOSIS — R918 Other nonspecific abnormal finding of lung field: Secondary | ICD-10-CM

## 2022-07-27 DIAGNOSIS — L4050 Arthropathic psoriasis, unspecified: Secondary | ICD-10-CM | POA: Diagnosis not present

## 2022-07-31 DIAGNOSIS — R413 Other amnesia: Secondary | ICD-10-CM | POA: Diagnosis not present

## 2022-07-31 DIAGNOSIS — N183 Chronic kidney disease, stage 3 unspecified: Secondary | ICD-10-CM | POA: Diagnosis not present

## 2022-07-31 DIAGNOSIS — R0789 Other chest pain: Secondary | ICD-10-CM | POA: Diagnosis not present

## 2022-07-31 DIAGNOSIS — E669 Obesity, unspecified: Secondary | ICD-10-CM | POA: Diagnosis not present

## 2022-07-31 DIAGNOSIS — K219 Gastro-esophageal reflux disease without esophagitis: Secondary | ICD-10-CM | POA: Diagnosis not present

## 2022-07-31 DIAGNOSIS — E781 Pure hyperglyceridemia: Secondary | ICD-10-CM | POA: Diagnosis not present

## 2022-07-31 DIAGNOSIS — Z87442 Personal history of urinary calculi: Secondary | ICD-10-CM | POA: Diagnosis not present

## 2022-07-31 DIAGNOSIS — G4700 Insomnia, unspecified: Secondary | ICD-10-CM | POA: Diagnosis not present

## 2022-07-31 DIAGNOSIS — R918 Other nonspecific abnormal finding of lung field: Secondary | ICD-10-CM | POA: Diagnosis not present

## 2022-07-31 DIAGNOSIS — R5383 Other fatigue: Secondary | ICD-10-CM | POA: Diagnosis not present

## 2022-07-31 DIAGNOSIS — G250 Essential tremor: Secondary | ICD-10-CM | POA: Diagnosis not present

## 2022-08-24 DIAGNOSIS — L4050 Arthropathic psoriasis, unspecified: Secondary | ICD-10-CM | POA: Diagnosis not present

## 2022-08-24 DIAGNOSIS — Z79620 Long term (current) use of immunosuppressive biologic: Secondary | ICD-10-CM | POA: Diagnosis not present

## 2022-09-21 DIAGNOSIS — L4050 Arthropathic psoriasis, unspecified: Secondary | ICD-10-CM | POA: Diagnosis not present

## 2022-10-16 DIAGNOSIS — Z79899 Other long term (current) drug therapy: Secondary | ICD-10-CM | POA: Diagnosis not present

## 2022-10-16 DIAGNOSIS — Z1330 Encounter for screening examination for mental health and behavioral disorders, unspecified: Secondary | ICD-10-CM | POA: Diagnosis not present

## 2022-10-16 DIAGNOSIS — M170 Bilateral primary osteoarthritis of knee: Secondary | ICD-10-CM | POA: Diagnosis not present

## 2022-10-16 DIAGNOSIS — L400 Psoriasis vulgaris: Secondary | ICD-10-CM | POA: Diagnosis not present

## 2022-10-16 DIAGNOSIS — L4050 Arthropathic psoriasis, unspecified: Secondary | ICD-10-CM | POA: Diagnosis not present

## 2022-10-19 DIAGNOSIS — L4050 Arthropathic psoriasis, unspecified: Secondary | ICD-10-CM | POA: Diagnosis not present

## 2022-11-16 DIAGNOSIS — L4050 Arthropathic psoriasis, unspecified: Secondary | ICD-10-CM | POA: Diagnosis not present

## 2022-11-19 DIAGNOSIS — M5136 Other intervertebral disc degeneration, lumbar region: Secondary | ICD-10-CM | POA: Diagnosis not present

## 2022-11-19 DIAGNOSIS — M1990 Unspecified osteoarthritis, unspecified site: Secondary | ICD-10-CM | POA: Diagnosis not present

## 2022-11-19 DIAGNOSIS — M542 Cervicalgia: Secondary | ICD-10-CM | POA: Diagnosis not present

## 2022-12-14 DIAGNOSIS — L4050 Arthropathic psoriasis, unspecified: Secondary | ICD-10-CM | POA: Diagnosis not present

## 2022-12-29 DIAGNOSIS — R030 Elevated blood-pressure reading, without diagnosis of hypertension: Secondary | ICD-10-CM | POA: Diagnosis not present

## 2022-12-29 DIAGNOSIS — J3489 Other specified disorders of nose and nasal sinuses: Secondary | ICD-10-CM | POA: Diagnosis not present

## 2022-12-29 DIAGNOSIS — J0100 Acute maxillary sinusitis, unspecified: Secondary | ICD-10-CM | POA: Diagnosis not present

## 2023-01-11 DIAGNOSIS — L4050 Arthropathic psoriasis, unspecified: Secondary | ICD-10-CM | POA: Diagnosis not present

## 2023-02-08 DIAGNOSIS — L4050 Arthropathic psoriasis, unspecified: Secondary | ICD-10-CM | POA: Diagnosis not present

## 2023-02-10 DIAGNOSIS — L4050 Arthropathic psoriasis, unspecified: Secondary | ICD-10-CM | POA: Diagnosis not present

## 2023-02-10 DIAGNOSIS — Z79899 Other long term (current) drug therapy: Secondary | ICD-10-CM | POA: Diagnosis not present

## 2023-02-23 DIAGNOSIS — D84821 Immunodeficiency due to drugs: Secondary | ICD-10-CM | POA: Diagnosis not present

## 2023-02-23 DIAGNOSIS — M170 Bilateral primary osteoarthritis of knee: Secondary | ICD-10-CM | POA: Diagnosis not present

## 2023-02-23 DIAGNOSIS — L4050 Arthropathic psoriasis, unspecified: Secondary | ICD-10-CM | POA: Diagnosis not present

## 2023-02-23 DIAGNOSIS — L400 Psoriasis vulgaris: Secondary | ICD-10-CM | POA: Diagnosis not present

## 2023-02-23 DIAGNOSIS — R7989 Other specified abnormal findings of blood chemistry: Secondary | ICD-10-CM | POA: Diagnosis not present

## 2023-02-23 DIAGNOSIS — Z79899 Other long term (current) drug therapy: Secondary | ICD-10-CM | POA: Diagnosis not present

## 2023-02-24 DIAGNOSIS — E781 Pure hyperglyceridemia: Secondary | ICD-10-CM | POA: Diagnosis not present

## 2023-02-24 DIAGNOSIS — E669 Obesity, unspecified: Secondary | ICD-10-CM | POA: Diagnosis not present

## 2023-02-24 DIAGNOSIS — N1831 Chronic kidney disease, stage 3a: Secondary | ICD-10-CM | POA: Diagnosis not present

## 2023-02-24 DIAGNOSIS — N200 Calculus of kidney: Secondary | ICD-10-CM | POA: Diagnosis not present

## 2023-03-05 DIAGNOSIS — R5383 Other fatigue: Secondary | ICD-10-CM | POA: Diagnosis not present

## 2023-03-05 DIAGNOSIS — Z1331 Encounter for screening for depression: Secondary | ICD-10-CM | POA: Diagnosis not present

## 2023-03-05 DIAGNOSIS — Z0000 Encounter for general adult medical examination without abnormal findings: Secondary | ICD-10-CM | POA: Diagnosis not present

## 2023-03-05 DIAGNOSIS — Z1339 Encounter for screening examination for other mental health and behavioral disorders: Secondary | ICD-10-CM | POA: Diagnosis not present

## 2023-03-05 DIAGNOSIS — Z87442 Personal history of urinary calculi: Secondary | ICD-10-CM | POA: Diagnosis not present

## 2023-03-05 DIAGNOSIS — Z23 Encounter for immunization: Secondary | ICD-10-CM | POA: Diagnosis not present

## 2023-03-05 DIAGNOSIS — K219 Gastro-esophageal reflux disease without esophagitis: Secondary | ICD-10-CM | POA: Diagnosis not present

## 2023-03-05 DIAGNOSIS — M51369 Other intervertebral disc degeneration, lumbar region without mention of lumbar back pain or lower extremity pain: Secondary | ICD-10-CM | POA: Diagnosis not present

## 2023-03-05 DIAGNOSIS — R918 Other nonspecific abnormal finding of lung field: Secondary | ICD-10-CM | POA: Diagnosis not present

## 2023-03-05 DIAGNOSIS — R413 Other amnesia: Secondary | ICD-10-CM | POA: Diagnosis not present

## 2023-03-05 DIAGNOSIS — N183 Chronic kidney disease, stage 3 unspecified: Secondary | ICD-10-CM | POA: Diagnosis not present

## 2023-03-05 DIAGNOSIS — R0789 Other chest pain: Secondary | ICD-10-CM | POA: Diagnosis not present

## 2023-03-05 DIAGNOSIS — E781 Pure hyperglyceridemia: Secondary | ICD-10-CM | POA: Diagnosis not present

## 2023-03-05 DIAGNOSIS — E669 Obesity, unspecified: Secondary | ICD-10-CM | POA: Diagnosis not present

## 2023-03-05 DIAGNOSIS — G250 Essential tremor: Secondary | ICD-10-CM | POA: Diagnosis not present

## 2023-03-08 ENCOUNTER — Other Ambulatory Visit: Payer: Self-pay | Admitting: Internal Medicine

## 2023-03-08 DIAGNOSIS — L4050 Arthropathic psoriasis, unspecified: Secondary | ICD-10-CM | POA: Diagnosis not present

## 2023-03-08 DIAGNOSIS — R918 Other nonspecific abnormal finding of lung field: Secondary | ICD-10-CM

## 2023-03-16 ENCOUNTER — Ambulatory Visit
Admission: RE | Admit: 2023-03-16 | Discharge: 2023-03-16 | Disposition: A | Discharge status: Home / Self Care | Payer: Medicare Other | Source: Ambulatory Visit | Attending: Internal Medicine | Admitting: Internal Medicine

## 2023-03-16 DIAGNOSIS — R918 Other nonspecific abnormal finding of lung field: Secondary | ICD-10-CM | POA: Diagnosis not present

## 2023-03-16 DIAGNOSIS — I700 Atherosclerosis of aorta: Secondary | ICD-10-CM | POA: Diagnosis not present

## 2023-04-05 DIAGNOSIS — L4050 Arthropathic psoriasis, unspecified: Secondary | ICD-10-CM | POA: Diagnosis not present

## 2023-04-06 ENCOUNTER — Ambulatory Visit (INDEPENDENT_AMBULATORY_CARE_PROVIDER_SITE_OTHER): Payer: Medicare Other | Admitting: Neurology

## 2023-04-06 VITALS — BP 118/74 | HR 68 | Ht 71.0 in | Wt 213.0 lb

## 2023-04-06 DIAGNOSIS — R4189 Other symptoms and signs involving cognitive functions and awareness: Secondary | ICD-10-CM | POA: Insufficient documentation

## 2023-04-06 DIAGNOSIS — R5382 Chronic fatigue, unspecified: Secondary | ICD-10-CM | POA: Insufficient documentation

## 2023-04-06 DIAGNOSIS — G4719 Other hypersomnia: Secondary | ICD-10-CM | POA: Insufficient documentation

## 2023-04-06 DIAGNOSIS — R6889 Other general symptoms and signs: Secondary | ICD-10-CM | POA: Diagnosis not present

## 2023-04-06 DIAGNOSIS — G250 Essential tremor: Secondary | ICD-10-CM | POA: Diagnosis not present

## 2023-04-06 MED ORDER — ALPRAZOLAM 1 MG PO TABS: 1.0000 mg | ORAL_TABLET | Freq: Every evening | ORAL | 0 refills | Status: DC | PRN

## 2023-04-06 NOTE — Patient Instructions (Signed)
Screening for Sleep Apnea  Sleep apnea is a condition in which breathing pauses or becomes shallow during sleep. Sleep apnea screening is a test to determine if you are at risk for sleep apnea. The test includes a series of questions. It will only takes a few minutes. Your health care provider may ask you to have this test in preparation for surgery or as part of a physical exam. What are the symptoms of sleep apnea? Common symptoms of sleep apnea include: Snoring. Waking up often at night. Daytime sleepiness. Pauses in breathing. Choking or gasping during sleep. Irritability. Forgetfulness. Trouble thinking clearly. Depression. Personality changes. Most people with sleep apnea do not know that they have it. What are the advantages of sleep apnea screening? Getting screened for sleep apnea can help: Ensure your safety. It is important for your health care providers to know whether or not you have sleep apnea, especially if you are having surgery or have other long-term (chronic) health conditions. Improve your health and allow you to get a better night's rest. Restful sleep can help you: Have more energy. Lose weight. Improve high blood pressure. Improve diabetes management. Prevent stroke. Prevent car accidents. What happens during the screening? Screening usually includes being asked a list of questions about your sleep quality. Some questions you may be asked include: Do you snore? Is your sleep restless? Do you have daytime sleepiness? Has a partner or spouse told you that you stop breathing during sleep? Have you had trouble concentrating or memory loss? What is your age? What is your neck circumference? To measure your neck, keep your back straight and gently wrap the tape measure around your neck. Put the tape measure at the middle of your neck, between your chin and collarbone. What is your sex assigned at birth? Do you have or are you being treated for high blood  pressure? If your screening test is positive, you are at risk for the condition. Further testing may be needed to confirm a diagnosis of sleep apnea. Where to find more information You can find screening tools online or at your health care clinic. For more information about sleep apnea screening and healthy sleep, visit these websites: Centers for Disease Control and Prevention: www.cdc.gov American Sleep Apnea Association: www.sleepapnea.org Contact a health care provider if: You think that you may have sleep apnea. Summary Sleep apnea screening can help determine if you are at risk for sleep apnea. It is important for your health care providers to know whether or not you have sleep apnea, especially if you are having surgery or have other chronic health conditions. You may be asked to take a screening test for sleep apnea in preparation for surgery or as part of a physical exam. This information is not intended to replace advice given to you by your health care provider. Make sure you discuss any questions you have with your health care provider. Document Revised: 03/15/2020 Document Reviewed: 03/15/2020 Elsevier Patient Education  2024 [employer]  

## 2023-04-06 NOTE — Progress Notes (Addendum)
SLEEP MEDICINE CLINIC    Provider:  Melvyn Novas, MD  Primary Care Physician:  Tally Joe, MD [phone] [address] [place] [address]     Referring Provider: Chilton Greathouse, Md [address] [place],  [place] [address]          Chief Complaint according to patient   Patient presents with:     New Sleep Patient (Initial Visit)           HISTORY OF PRESENT ILLNESS:  Scott Dominguez is a 67 y.o. male patient who is seen upon referral on 04/06/2023 from Dr Felipa Eth  for a Sleep consultation .  Chief concern according to patient :  "I keep regular bed times, drinks little caffeine and I have fatigue in daytime, mostly after a meal, sometimes I will a take a nap in AM and PM.    I have the pleasure of seeing Scott Dominguez 04/06/23 a left-handed male with a possible sleep disorder.      Sleep relevant medical history:  0-2 Nocturia, Nightmares -now rarely,  had a  Tonsillectomy, ear surgery, dental : wisdom teeth extracted,  Kidney stones. Calcium oxylate., Vit D deficiency, rheumatoid arthritis.     Family medical /sleep history: no other family member on CPAP with OSA, insomnia, sleep walkers.  Sister dix with early Alzheimer's . Mother died of heart disease at age 78, had a PDO, father died at 37    Social history:  Patient is retired from Airline pilot ( in Equities trader )  and lives in a household with his second spouse .  Divorced.  Family status is married, with 3 adult children, 4 grandchildren.  Pets : one dog.  Tobacco use; none.  ETOH use ; wine with dinner, Caffeine intake in form of Coffee( small coffee) Soda( /) Tea ( 3/ week in PM ) and no energy drinks Exercise : "try to walk"   Sleep habits are as follows: The patient's dinner time is between 4-5 PM. The patient goes to bed at 9-10 PM and continues to sleep for 6-7  hours, wakes for 0-2 bathroom breaks.  Looks at the clock.  The preferred sleep position is prone , with the support of 2 pillows.   Dreams are reportedly not vivid.  The patient wakes up spontaneously 5 AM , 6 AM is the usual rise time. He reports not feeling refreshed or restored in AM, with symptoms such as dry mouth, morning headaches, and residual fatigue.  Naps are taken frequently, lasting from 20-120  minutes ( 30 minutes  in AM - 2 hours in Pm , not refreshing) and are  interfering with nocturnal sleep.    Review of Systems: Out of a complete 14 system review, the patient complains of only the following symptoms, and all other reviewed systems are negative.:   Forgetful. Being " absent ' during conversations, not remembering  his address [facility] or phone number when he registered to vote (!) . Wife stated  he has been unresponsive as if in trance during conversations.   Tremor   Chronic and longstanding Fatigue ( has autoimmune condition and depression) , sleepiness , unclear snoring, - he sleeps in a separate room  fragmented sleep, no Nocturia   Essential tremor.   How likely are you to doze in the following situations: 0 = not likely, 1 = slight chance, 2 = moderate chance, 3 = high chance   Sitting and Reading? Watching  Television? Sitting inactive in a public place (theater or meeting)? As a passenger in a car for an hour without a break? Lying down in the afternoon when circumstances permit? Sitting and talking to someone? Sitting quietly after lunch without alcohol? In a car, while stopped for a few minutes in traffic?   Total = 9/ 24 points   FSS endorsed at 48/ 63 points.   GDS : 11/ 15   Social History   Socioeconomic History   Marital status: Single    Spouse name: Not on file   Number of children: Not on file   Years of education: Not on file   Highest education level: Not on file  Occupational History   Not on file  Tobacco Use   Smoking status: Never   Smokeless tobacco: Never  Substance and Sexual Activity   Alcohol use: No   Drug use: No   Sexual activity: Not on file   Other Topics Concern   Not on file  Social History Narrative   Not on file   Social Drivers of Health   Financial Resource Strain: Not on file  Food Insecurity: No Food Insecurity (09/10/2021)   Received from [facility], [facility]   Hunger Vital Sign    Worried About Running Out of Food in the Last Year: Never true    Ran Out of Food in the Last Year: Never true  Transportation Needs: Not on file  Physical Activity: Not on file  Stress: Not on file  Social Connections: Unknown (08/17/2021)   Received from [facility], [facility]   Social Network    Social Network: Not on file    Family History  Problem Relation Age of Onset   Heart disease Mother     Past Medical History:  Diagnosis Date   Anxiety    Asthma    no porblem in a year   Chronic abdominal pain    Chronic foot pain    bilateral   Depression    GERD (gastroesophageal reflux disease)    Headache    Kidney stones    multiple over the past 30 years   Psoriasis    Vertigo     Past Surgical History:  Procedure Laterality Date   APPENDECTOMY     CHOLECYSTECTOMY N/A 07/11/2014   Procedure: LAPAROSCOPIC CHOLECYSTECTOMY;  Surgeon: Violeta Gelinas, MD;  Location: [facility] OR;  Service: General;  Laterality: N/A;   COLONOSCOPY     CYSTOSCOPY WITH RETROGRADE PYELOGRAM, URETEROSCOPY AND STENT PLACEMENT Right 12/18/2014   Procedure: CYSTOSCOPY WITH RETROGRADE PYELOGRAM, URETEROSCOPY AND EXTRACTION STONE BASKE AND RIGHT STENT PLACEMENT;  Surgeon: Jethro Bolus, MD;  Location: [facility] ORS;  Service: Urology;  Laterality: Right;   ear drum surgery     had graft   HOLMIUM LASER APPLICATION Right 12/18/2014   Procedure: HOLMIUM LASER APPLICATION;  Surgeon: Jethro Bolus, MD;  Location: [facility] ORS;  Service: Urology;  Laterality: Right;   LITHOTRIPSY  12/17/2014   TONSILLECTOMY     WISDOM TOOTH EXTRACTION       Current Outpatient Medications on File Prior to Visit  Medication Sig Dispense Refill   amitriptyline  (ELAVIL) 25 MG tablet Take 25 mg by mouth at bedtime.     Cholecalciferol (VITAMIN D PO) Take 5,000 Units by mouth every morning.     DEXILANT 60 MG capsule Take 1 capsule by mouth every morning.  10   Multiple Vitamin (MULTIVITAMIN WITH MINERALS) TABS tablet Take 1 tablet by mouth every morning.  promethazine (PHENERGAN) 25 MG suppository Place 25 mg rectally every 8 (eight) hours as needed.     rosuvastatin (CRESTOR) 5 MG tablet Take 5 mg by mouth daily.     cholestyramine (QUESTRAN) 4 G packet MIX 1 PACKET WITH WATER OR NON-CARBONATED DRINK TWICE A DAY FOR 30 DAYS (Patient not taking: Reported on 04/06/2023)  11   ENBREL SURECLICK 50 MG/ML injection Inject 50 mg as directed every 7 (seven) days. (Patient not taking: Reported on 04/06/2023)  4   Fluticasone-Salmeterol (ADVAIR) 250-50 MCG/DOSE AEPB Inhale 1 puff into the lungs 2 (two) times daily as needed (for asthma attacks).  (Patient not taking: Reported on 04/06/2023)     ibuprofen (ADVIL,MOTRIN) 200 MG tablet Take 600 mg by mouth every 6 (six) hours as needed for moderate pain. (Patient not taking: Reported on 04/06/2023)     oxyCODONE (ROXICODONE) 5 MG immediate release tablet Take 1 tablet (5 mg total) by mouth every 4 (four) hours as needed. (Patient not taking: Reported on 04/06/2023) 15 tablet 0   oxyCODONE-acetaminophen (ROXICET) 5-325 MG per tablet Take 1 tablet by mouth every 4 (four) hours as needed for severe pain. (Patient not taking: Reported on 04/06/2023) 30 tablet 0   phenazopyridine (PYRIDIUM) 200 MG tablet Take 1 tablet (200 mg total) by mouth 3 (three) times daily as needed for pain (for urinary burning and spasm). (Patient not taking: Reported on 04/06/2023) 30 tablet 3   tamsulosin (FLOMAX) 0.4 MG CAPS capsule Take 1 capsule (0.4 mg total) by mouth daily. (Patient not taking: Reported on 04/06/2023) 21 capsule 0   No current facility-administered medications on file prior to visit.    Allergies  Allergen Reactions    Ivp Dye [Iodinated Contrast Media] Hives     DIAGNOSTIC DATA (LABS, IMAGING, TESTING) - I reviewed patient records, labs, notes, testing and imaging myself where available.  Lab Results  Component Value Date   WBC 8.3 07/11/2014   HGB 14.5 07/11/2014   HCT 42.0 07/11/2014   MCV 92.9 07/11/2014   PLT 270 07/11/2014      Component Value Date/Time   NA 137 06/09/2014 1009   K 3.7 06/09/2014 1009   CL 103 06/09/2014 1009   CO2 27 06/09/2014 1009   GLUCOSE 109 (H) 06/09/2014 1009   BUN 12 06/09/2014 1009   CREATININE 1.22 06/09/2014 1009   CALCIUM 9.4 06/09/2014 1009   PROT 7.2 06/09/2014 1009   ALBUMIN 4.2 06/09/2014 1009   AST 20 06/09/2014 1009   ALT 28 06/09/2014 1009   ALKPHOS 56 06/09/2014 1009   BILITOT 0.9 06/09/2014 1009   GFRNONAA 63 (L) 06/09/2014 1009   GFRAA 73 (L) 06/09/2014 1009   No results found for: "CHOL", "HDL", "LDLCALC", "LDLDIRECT", "TRIG", "CHOLHDL" No results found for: "HGBA1C" No results found for: "VITAMINB12" No results found for: "TSH"   All medical data are copied to MEDIA - referral notes dr Felipa Eth.   PHYSICAL EXAM:  Today's Vitals   04/06/23 0859  BP: 118/74  Pulse: 68  Weight: 213 lb (96.6 kg)  Height: 5\' 11"  (1.803 m)   Body mass index is 29.71 kg/m.   Wt Readings from Last 3 Encounters:  04/06/23 213 lb (96.6 kg)  12/18/14 187 lb 12.8 oz (85.2 kg)  12/17/14 185 lb (83.9 kg)     Ht Readings from Last 3 Encounters:  04/06/23 5\' 11"  (1.803 m)  12/18/14 5\' 11"  (1.803 m)  12/17/14 5\' 11"  (1.803 m)      General: The  patient is awake, alert and appears not in acute distress. The patient is well groomed. Head: Normocephalic, atraumatic. Neck is supple.  Mallampati  2,  neck circumference:17.7  inches . Nasal airflow is patent.  Retrognathia is  seen.  Dental status: biological  Cardiovascular:  Regular rate and cardiac rhythm by pulse,  without distended neck veins. Respiratory: Lungs are clear to auscultation.  Skin:   Without evidence of ankle edema, or rash. Trunk: The patient's posture is erect.   NEUROLOGIC EXAM: The patient is awake and alert, oriented to place and time.   Memory subjective described as impaired :    04/06/2023    9:00 AM  Montreal Cognitive Assessment   Visuospatial/ Executive (0/5) 5  Naming (0/3) 3  Attention: Read list of digits (0/2) 2  Attention: Read list of letters (0/1) 1  Attention: Serial 7 subtraction starting at 100 (0/3) 3  Language: Repeat phrase (0/2) 2  Language : Fluency (0/1) 1  Abstraction (0/2) 2  Delayed Recall (0/5) 3  Orientation (0/6) 6  Total 28    Attention span & concentration ability appears normal.  Speech is fluent,  without  dysarthria, dysphonia or aphasia.  Mood and affect are appropriate.   Cranial nerves: no loss of smell or taste reported  Pupils are equal and briskly reactive to light.  Extraocular movements in vertical and horizontal planes were intact and without nystagmus. No Diplopia. Visual fields by finger perimetry are intact. Hearing was intact to soft voice and finger rubbing.    Facial sensation intact to fine touch.  Facial motor strength is symmetric and tongue and uvula move midline.  Neck ROM : rotation, tilt and flexion extension were normal for age and shoulder shrug was symmetrical.    Motor exam:  Symmetric bulk, tone and ROM.   Normal tone without cog wheeling, symmetric grip strength .   Sensory:  Fine touch, pinprick and vibration intact. Proprioception tested in the upper extremities was normal.   Coordination: Rapid alternating movements in the fingers/hands were of normal speed.  The Finger-to-nose maneuver was intact without evidence of ataxia, dysmetria but tremor.  TREMOGRAM attached .    Gait and station: Patient could rise unassisted from a seated position, walked without assistive device.  Stance is of normal width/ base .  Toe and heel walk were deferred.  Deep tendon reflexes: in the  upper  and lower extremities are symmetric and intact.  Babinski response was deferred.    ASSESSMENT AND PLAN 67 y.o. year old male  here with:  primary referral for fatigue/ sleepiness. Ruling out OSA.     1) high degree of fatigue, also having Rheumatoid Arthritis, depression anxiety and worried about memory and tremor (  with action )   2) sleepiness is high- 2 naps a day is by itself a high degree of daytime sleepiness.   3)  action tremor, left hand dominant - Tremogram attached, left handed individual with bilateral action tremor.   4) Cognitive concerns, inattention ? His memory test today : MOCA 28/ 30.  Younger  Sister has been dx with early Alzheimer's.    I ordered a HST for this patient to screen for sleep apnea.  I ordered ATn, Alzheimer's risk panel, MRI brain and neuropsychological evaluation.     I plan to follow up either personally or through our NP within 3-5 months.  I would like to thank Tally Joe, MD and Chilton Greathouse, Md [address] [place],  [place] [address]  for allowing me to meet with and to take care of this pleasant patient.    After spending a total time of  45  minutes face to face and additional time for physical and neurologic examination, review of laboratory studies,  personal review of imaging studies, reports and results of other testing and review of referral information / records as far as provided in visit,   Electronically signed by: Melvyn Novas, MD 04/06/2023 9:17 AM  [facility] Board certified by The [employer] of Sleep Medicine and Diplomate of the [employer] of Sleep Medicine. Board certified In Neurology through the ABPN, Fellow of the [employer] of Neurology.

## 2023-04-08 ENCOUNTER — Encounter: Payer: Self-pay | Admitting: Neurology

## 2023-04-08 ENCOUNTER — Telehealth: Payer: Self-pay | Admitting: Neurology

## 2023-04-08 ENCOUNTER — Other Ambulatory Visit: Payer: Self-pay | Admitting: Neurology

## 2023-04-08 NOTE — Telephone Encounter (Signed)
medicare/BCBS sup NPR sent to GI [phone] 

## 2023-04-09 MED ORDER — ALPRAZOLAM 1 MG PO TABS: 1.0000 mg | ORAL_TABLET | Freq: Every evening | ORAL | 0 refills | Status: AC | PRN

## 2023-04-14 LAB — ATN PROFILE
A -- Beta-amyloid 42/40 Ratio: 0.098 — ABNORMAL LOW (ref >0.102)
Beta-amyloid 40: 198.04 pg/mL
Beta-amyloid 42: 19.46 pg/mL
N -- NfL, Plasma: 2.31 pg/mL (ref 0.00–4.61)
T -- p-tau181: 1.18 pg/mL — ABNORMAL HIGH (ref 0.00–0.97)

## 2023-04-14 LAB — APOE ALZHEIMER'S RISK

## 2023-04-15 DIAGNOSIS — N183 Chronic kidney disease, stage 3 unspecified: Secondary | ICD-10-CM | POA: Diagnosis not present

## 2023-04-15 DIAGNOSIS — R5383 Other fatigue: Secondary | ICD-10-CM | POA: Diagnosis not present

## 2023-04-15 DIAGNOSIS — H9203 Otalgia, bilateral: Secondary | ICD-10-CM | POA: Diagnosis not present

## 2023-04-15 DIAGNOSIS — Z1152 Encounter for screening for COVID-19: Secondary | ICD-10-CM | POA: Diagnosis not present

## 2023-04-15 DIAGNOSIS — J0100 Acute maxillary sinusitis, unspecified: Secondary | ICD-10-CM | POA: Diagnosis not present

## 2023-04-15 DIAGNOSIS — R0981 Nasal congestion: Secondary | ICD-10-CM | POA: Diagnosis not present

## 2023-04-15 DIAGNOSIS — J3489 Other specified disorders of nose and nasal sinuses: Secondary | ICD-10-CM | POA: Diagnosis not present

## 2023-04-15 DIAGNOSIS — R030 Elevated blood-pressure reading, without diagnosis of hypertension: Secondary | ICD-10-CM | POA: Diagnosis not present

## 2023-04-28 DIAGNOSIS — H9313 Tinnitus, bilateral: Secondary | ICD-10-CM | POA: Diagnosis not present

## 2023-04-28 DIAGNOSIS — H818X9 Other disorders of vestibular function, unspecified ear: Secondary | ICD-10-CM | POA: Diagnosis not present

## 2023-04-28 DIAGNOSIS — R2689 Other abnormalities of gait and mobility: Secondary | ICD-10-CM | POA: Diagnosis not present

## 2023-04-28 DIAGNOSIS — R12 Heartburn: Secondary | ICD-10-CM | POA: Diagnosis not present

## 2023-04-28 DIAGNOSIS — J329 Chronic sinusitis, unspecified: Secondary | ICD-10-CM | POA: Diagnosis not present

## 2023-05-03 DIAGNOSIS — L4050 Arthropathic psoriasis, unspecified: Secondary | ICD-10-CM | POA: Diagnosis not present

## 2023-05-31 DIAGNOSIS — R1115 Cyclical vomiting syndrome unrelated to migraine: Secondary | ICD-10-CM | POA: Diagnosis not present

## 2023-05-31 DIAGNOSIS — K219 Gastro-esophageal reflux disease without esophagitis: Secondary | ICD-10-CM | POA: Diagnosis not present

## 2023-05-31 DIAGNOSIS — R197 Diarrhea, unspecified: Secondary | ICD-10-CM | POA: Diagnosis not present

## 2023-05-31 DIAGNOSIS — L4050 Arthropathic psoriasis, unspecified: Secondary | ICD-10-CM | POA: Diagnosis not present

## 2023-06-03 DIAGNOSIS — R197 Diarrhea, unspecified: Secondary | ICD-10-CM | POA: Diagnosis not present

## 2023-06-24 DIAGNOSIS — L4050 Arthropathic psoriasis, unspecified: Secondary | ICD-10-CM | POA: Diagnosis not present

## 2023-07-29 DIAGNOSIS — D122 Benign neoplasm of ascending colon: Secondary | ICD-10-CM | POA: Diagnosis not present

## 2023-07-29 DIAGNOSIS — K635 Polyp of colon: Secondary | ICD-10-CM | POA: Diagnosis not present

## 2023-07-29 DIAGNOSIS — K3189 Other diseases of stomach and duodenum: Secondary | ICD-10-CM | POA: Diagnosis not present

## 2023-07-29 DIAGNOSIS — K219 Gastro-esophageal reflux disease without esophagitis: Secondary | ICD-10-CM | POA: Diagnosis not present

## 2023-07-29 DIAGNOSIS — K2950 Unspecified chronic gastritis without bleeding: Secondary | ICD-10-CM | POA: Diagnosis not present

## 2023-07-29 DIAGNOSIS — R197 Diarrhea, unspecified: Secondary | ICD-10-CM | POA: Diagnosis not present

## 2023-07-29 DIAGNOSIS — R112 Nausea with vomiting, unspecified: Secondary | ICD-10-CM | POA: Diagnosis not present

## 2023-07-29 DIAGNOSIS — K6389 Other specified diseases of intestine: Secondary | ICD-10-CM | POA: Diagnosis not present

## 2023-07-29 DIAGNOSIS — D124 Benign neoplasm of descending colon: Secondary | ICD-10-CM | POA: Diagnosis not present

## 2023-08-02 DIAGNOSIS — L4050 Arthropathic psoriasis, unspecified: Secondary | ICD-10-CM | POA: Diagnosis not present

## 2023-08-27 DIAGNOSIS — Z87442 Personal history of urinary calculi: Secondary | ICD-10-CM | POA: Diagnosis not present

## 2023-08-27 DIAGNOSIS — K219 Gastro-esophageal reflux disease without esophagitis: Secondary | ICD-10-CM | POA: Diagnosis not present

## 2023-08-27 DIAGNOSIS — E781 Pure hyperglyceridemia: Secondary | ICD-10-CM | POA: Diagnosis not present

## 2023-08-27 DIAGNOSIS — R918 Other nonspecific abnormal finding of lung field: Secondary | ICD-10-CM | POA: Diagnosis not present

## 2023-08-27 DIAGNOSIS — F4321 Adjustment disorder with depressed mood: Secondary | ICD-10-CM | POA: Diagnosis not present

## 2023-08-27 DIAGNOSIS — N183 Chronic kidney disease, stage 3 unspecified: Secondary | ICD-10-CM | POA: Diagnosis not present

## 2023-08-27 DIAGNOSIS — R5383 Other fatigue: Secondary | ICD-10-CM | POA: Diagnosis not present

## 2023-08-27 DIAGNOSIS — L409 Psoriasis, unspecified: Secondary | ICD-10-CM | POA: Diagnosis not present

## 2023-08-27 DIAGNOSIS — R0789 Other chest pain: Secondary | ICD-10-CM | POA: Diagnosis not present

## 2023-08-27 DIAGNOSIS — M1990 Unspecified osteoarthritis, unspecified site: Secondary | ICD-10-CM | POA: Diagnosis not present

## 2023-08-27 DIAGNOSIS — E669 Obesity, unspecified: Secondary | ICD-10-CM | POA: Diagnosis not present

## 2023-08-27 DIAGNOSIS — G250 Essential tremor: Secondary | ICD-10-CM | POA: Diagnosis not present

## 2023-08-30 DIAGNOSIS — L4050 Arthropathic psoriasis, unspecified: Secondary | ICD-10-CM | POA: Diagnosis not present

## 2023-09-03 DIAGNOSIS — L400 Psoriasis vulgaris: Secondary | ICD-10-CM | POA: Diagnosis not present

## 2023-09-03 DIAGNOSIS — Z79899 Other long term (current) drug therapy: Secondary | ICD-10-CM | POA: Diagnosis not present

## 2023-09-03 DIAGNOSIS — M170 Bilateral primary osteoarthritis of knee: Secondary | ICD-10-CM | POA: Diagnosis not present

## 2023-09-03 DIAGNOSIS — L4050 Arthropathic psoriasis, unspecified: Secondary | ICD-10-CM | POA: Diagnosis not present

## 2023-09-23 DIAGNOSIS — R7989 Other specified abnormal findings of blood chemistry: Secondary | ICD-10-CM | POA: Diagnosis not present

## 2023-09-23 DIAGNOSIS — L4050 Arthropathic psoriasis, unspecified: Secondary | ICD-10-CM | POA: Diagnosis not present

## 2023-09-27 DIAGNOSIS — L4050 Arthropathic psoriasis, unspecified: Secondary | ICD-10-CM | POA: Diagnosis not present

## 2023-10-18 DIAGNOSIS — D225 Melanocytic nevi of trunk: Secondary | ICD-10-CM | POA: Diagnosis not present

## 2023-10-18 DIAGNOSIS — L821 Other seborrheic keratosis: Secondary | ICD-10-CM | POA: Diagnosis not present

## 2023-10-18 DIAGNOSIS — D485 Neoplasm of uncertain behavior of skin: Secondary | ICD-10-CM | POA: Diagnosis not present

## 2023-10-25 DIAGNOSIS — L4050 Arthropathic psoriasis, unspecified: Secondary | ICD-10-CM | POA: Diagnosis not present

## 2023-10-25 DIAGNOSIS — R1115 Cyclical vomiting syndrome unrelated to migraine: Secondary | ICD-10-CM | POA: Diagnosis not present

## 2023-10-25 DIAGNOSIS — K219 Gastro-esophageal reflux disease without esophagitis: Secondary | ICD-10-CM | POA: Diagnosis not present

## 2023-10-27 DIAGNOSIS — K219 Gastro-esophageal reflux disease without esophagitis: Secondary | ICD-10-CM | POA: Diagnosis not present

## 2023-10-27 DIAGNOSIS — Z803 Family history of malignant neoplasm of breast: Secondary | ICD-10-CM | POA: Diagnosis not present

## 2023-10-27 DIAGNOSIS — N63 Unspecified lump in unspecified breast: Secondary | ICD-10-CM | POA: Diagnosis not present

## 2023-10-27 DIAGNOSIS — R030 Elevated blood-pressure reading, without diagnosis of hypertension: Secondary | ICD-10-CM | POA: Diagnosis not present

## 2023-10-27 DIAGNOSIS — N644 Mastodynia: Secondary | ICD-10-CM | POA: Diagnosis not present

## 2023-11-02 ENCOUNTER — Other Ambulatory Visit (HOSPITAL_BASED_OUTPATIENT_CLINIC_OR_DEPARTMENT_OTHER): Payer: Self-pay | Admitting: Internal Medicine

## 2023-11-02 DIAGNOSIS — Z803 Family history of malignant neoplasm of breast: Secondary | ICD-10-CM

## 2023-11-02 DIAGNOSIS — N644 Mastodynia: Secondary | ICD-10-CM

## 2023-11-02 DIAGNOSIS — N63 Unspecified lump in unspecified breast: Secondary | ICD-10-CM

## 2023-11-10 ENCOUNTER — Ambulatory Visit
Admission: RE | Admit: 2023-11-10 | Discharge: 2023-11-10 | Disposition: A | Discharge status: Home / Self Care | Source: Ambulatory Visit | Attending: Internal Medicine | Admitting: Internal Medicine

## 2023-11-10 ENCOUNTER — Ambulatory Visit

## 2023-11-10 DIAGNOSIS — N644 Mastodynia: Secondary | ICD-10-CM

## 2023-11-10 DIAGNOSIS — N62 Hypertrophy of breast: Secondary | ICD-10-CM | POA: Diagnosis not present

## 2023-11-10 DIAGNOSIS — N63 Unspecified lump in unspecified breast: Secondary | ICD-10-CM

## 2023-11-10 DIAGNOSIS — Z803 Family history of malignant neoplasm of breast: Secondary | ICD-10-CM

## 2023-11-18 ENCOUNTER — Encounter: Payer: Self-pay | Admitting: Pulmonary Disease

## 2023-11-18 ENCOUNTER — Ambulatory Visit (INDEPENDENT_AMBULATORY_CARE_PROVIDER_SITE_OTHER): Admitting: Pulmonary Disease

## 2023-11-18 VITALS — BP 130/80 | HR 83 | Temp 98.6°F | Ht 71.0 in | Wt 222.2 lb

## 2023-11-18 DIAGNOSIS — J849 Interstitial pulmonary disease, unspecified: Secondary | ICD-10-CM | POA: Diagnosis not present

## 2023-11-18 DIAGNOSIS — R0600 Dyspnea, unspecified: Secondary | ICD-10-CM

## 2023-11-18 LAB — CBC WITH DIFFERENTIAL/PLATELET
Basophils Absolute: 0.1 K/uL (ref 0.0–0.1)
Basophils Relative: 0.6 % (ref 0.0–3.0)
Eosinophils Absolute: 0.5 K/uL (ref 0.0–0.7)
Eosinophils Relative: 5.7 % — ABNORMAL HIGH (ref 0.0–5.0)
HCT: 41.6 % (ref 39.0–52.0)
Hemoglobin: 14.1 g/dL (ref 13.0–17.0)
Lymphocytes Relative: 30.6 % (ref 12.0–46.0)
Lymphs Abs: 2.7 K/uL (ref 0.7–4.0)
MCHC: 33.8 g/dL (ref 30.0–36.0)
MCV: 91.8 fl (ref 78.0–100.0)
Monocytes Absolute: 0.8 K/uL (ref 0.1–1.0)
Monocytes Relative: 9.1 % (ref 3.0–12.0)
Neutro Abs: 4.7 K/uL (ref 1.4–7.7)
Neutrophils Relative %: 54 % (ref 43.0–77.0)
Platelets: 264 K/uL (ref 150.0–400.0)
RBC: 4.54 Mil/uL (ref 4.22–5.81)
RDW: 13.3 % (ref 11.5–15.5)
WBC: 8.8 K/uL (ref 4.0–10.5)

## 2023-11-18 NOTE — Progress Notes (Signed)
 Scott Dominguez    [id number]    12/12/1955  Primary Care Physician:Avva, Searcy, MD  Referring Physician: Seabron Lenis, MD [address] [place],  [place] [address]  Chief complaint: Consult for dyspnea, abnormal CT   HPI: 68 y.o. who  has a past medical history of Anxiety, Asthma, Chronic abdominal pain, Chronic foot pain, Depression, GERD (gastroesophageal reflux disease), Headache, Kidney stones, Psoriasis, and Vertigo.  Discussed the use of AI scribe software for clinical note transcription with the patient, who gave verbal consent to proceed.  History of Present Illness Scott Dominguez is a 68 year old male with asthma and psoriatic arthritis who presents with shortness of breath.  Dyspnea and chest discomfort - Shortness of breath primarily during exertion, such as bending, crouching, or working in heat - Symptoms more pronounced when crouching or bending over, including activities like changing a water filter in a crawl space - Cold weather exacerbates shortness of breath and causes chest discomfort - Occasional wheezing, particularly during exertion - Oxygen saturation measured at 93-94% during episodes of dyspnea - No chest pain, shortness of breath, or palpitations at rest - No history of smoking - No significant environmental exposures contributing to lung disease; 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 Wallie at [facility]  Gastroesophageal reflux disease (gerd) - Acid reflux managed with amitriptyline at night and Dexilant in the morning - Occasional use of promethazine  suppositories for severe episodes - Reflux episodes occur three to four times per year, causing throat irritation   Relevant Pulmonary history: Pets: Dog Occupation: Retired Medical illustrator for [employer] Exposures: No mold, hot tub, Financial controller.  No feather pillows or comforter No h/o chemo/XRT/amiodarone/macrodantin/MTX  No exposure to asbestos, silica or other organic allergens  Smoking history: Never smoker Travel history: No significant travel history Family history: No family history of lung disease   Outpatient Encounter Medications as of 11/18/2023  Medication Sig   amitriptyline (ELAVIL) 25 MG tablet Take 25 mg by mouth at bedtime.   CERTOLIZUMAB PEGOL  Schley Inject 400 mg into the skin.   Cholecalciferol (VITAMIN D PO) Take 5,000 Units by mouth every morning.   DEXILANT 60 MG capsule Take 1 capsule by mouth every morning. (Patient taking differently: Take 1 capsule by mouth every morning. Taking 30 every mroning.)   ibuprofen (ADVIL,MOTRIN) 200 MG tablet Take 600 mg by mouth every 6 (six) hours as needed for moderate pain.   oxyCODONE  (ROXICODONE ) 5 MG immediate release tablet Take 1 tablet (5 mg total) by mouth every 4 (four) hours as needed.   promethazine  (PHENERGAN ) 25 MG suppository Place 25 mg rectally every 8 (eight) hours as needed.   rosuvastatin (CRESTOR) 5 MG tablet Take 5 mg by mouth daily.   ALPRAZolam  (XANAX ) 1 MG tablet Take 1 tablet (1 mg total) by mouth at bedtime as needed for anxiety (for use during sleep test). (Patient not taking: Reported  on 11/18/2023)   cholestyramine (QUESTRAN) 4 G packet MIX 1 PACKET WITH WATER OR NON-CARBONATED DRINK TWICE A DAY FOR 30 DAYS (Patient not taking: Reported on 11/18/2023)   ENBREL SURECLICK 50 MG/ML injection Inject 50 mg as directed every 7 (seven) days.  (Patient not taking: Reported on 11/18/2023)   Fluticasone-Salmeterol (ADVAIR) 250-50 MCG/DOSE AEPB Inhale 1 puff into the lungs 2 (two) times daily as needed (for asthma attacks).  (Patient not taking: Reported on 11/18/2023)   Multiple Vitamin (MULTIVITAMIN WITH MINERALS) TABS tablet Take 1 tablet by mouth every morning. (Patient not taking: Reported on 11/18/2023)   oxyCODONE -acetaminophen  (ROXICET) 5-325 MG per tablet Take 1 tablet by mouth every 4 (four) hours as needed for severe pain. (Patient not taking: Reported on 11/18/2023)   phenazopyridine  (PYRIDIUM ) 200 MG tablet Take 1 tablet (200 mg total) by mouth 3 (three) times daily as needed for pain (for urinary burning and spasm). (Patient not taking: Reported on 11/18/2023)   tamsulosin  (FLOMAX ) 0.4 MG CAPS capsule Take 1 capsule (0.4 mg total) by mouth daily. (Patient not taking: Reported on 11/18/2023)   No facility-administered encounter medications on file as of 11/18/2023.     Physical Exam: Today's Vitals   11/18/23 0838  Weight: 222 lb 3.2 oz (100.8 kg)  Height: 5' 11 (1.803 m)   Body mass index is 30.99 kg/m.  Physical Exam GEN: No acute distress. CV: Regular rate and rhythm, heart sounds normal, no murmurs. LUNGS: Clear to auscultation bilaterally, normal respiratory effort. SKIN JOINTS: Warm and dry, no rash.   Data Reviewed: Imaging: CT high-resolution 07/08/2022-mild basilar subpleural reticulation CT chest 03/16/2023-stable mild subpleural reticulation right greater than left, minimal air trapping. I reviewed images personally.  PFTs:  Labs: CBC 06/09/2014-WBC 10.3, eos 2%, absolute eosinophil count 206 Assessment & Plan Shortness of breath with exertion and positional changes Shortness of breath occurs during exertion, bending, and exposure to temperature changes. Positional changes exacerbate symptoms, likely due to pressure on the lungs. Oxygen saturation remains in the low 90s during exertion, which is  acceptable. Weight gain from amitriptyline may contribute to symptoms. - Order lung function test to assess impact on lung function and asthma - Order prone imaging CT scan to evaluate lung expansion and interstitial lung disease  Very mild interstitial lung disease (stable) CT scans from early and late 2024 show very mild changes at the lung bases, possibly indicating early interstitial lung disease. Changes are stable and non-significant. Differential includes autoimmune diseases and acid reflux-related scarring. Prone imaging may reveal improved lung expansion, ruling out interstitial lung disease. - Order prone imaging high-resolution CT scan - Check basic blood tests for autoimmune diseases, ANA, rheumatoid factor, CCP  Asthma (mild, not requiring inhalers) Asthma symptoms are mild and occur primarily during heat exposure and exertion. He does not use prescribed inhalers, indicating minimal impact on daily life. Lung function test will provide further insight into asthma severity. - Order lung function test to assess asthma severity - Check CBC differential, IgE  Gastroesophageal reflux disease with episodic severe symptoms GERD managed with amitriptyline, Dexilant, and promethazine  suppositories. Severe episodes occur 3-4 times a year, causing heart attack-like pain and acid regurgitation. Potential contribution to lung scarring due to acid aspiration. - Continue current GERD management with amitriptyline, Dexilant, and promethazine  as needed  Recommendations: CTD serologies, CBC with differential, IgE High-res CT, PFTs  Lonna Coder MD [facility] 11/18/2023, 8:43 AM  CC: Seabron Lenis, MD

## 2023-11-18 NOTE — Patient Instructions (Signed)
  VISIT SUMMARY: Today, you were seen for shortness of breath, which occurs mainly during exertion and positional changes. We also reviewed your history of asthma, psoriatic arthritis, and gastroesophageal reflux disease (GERD).  YOUR PLAN: -SHORTNESS OF BREATH WITH EXERTION AND POSITIONAL CHANGES: Your shortness of breath happens mainly when you exert yourself, bend over, or are exposed to temperature changes. This may be due to pressure on your lungs. We will conduct a lung function test and a prone imaging CT scan to better understand your lung function and check for any lung diseases.  -VERY MILD INTERSTITIAL LUNG DISEASE (STABLE): Your CT scans show very mild changes in your lungs, which are stable and not significant. This could be due to autoimmune diseases or acid reflux. We will do a prone imaging CT scan and some blood tests to check for autoimmune diseases.  -ASTHMA (MILD, NOT REQUIRING INHALERS): Your asthma symptoms are mild and mainly occur during heat exposure and exertion. Since you do not use your prescribed inhalers, it seems to have a minimal impact on your daily life. We will do a lung function test to assess the severity of your asthma.  -GASTROESOPHAGEAL REFLUX DISEASE WITH EPISODIC SEVERE SYMPTOMS: Your GERD is managed with amitriptyline, Dexilant, and promethazine . You experience severe episodes a few times a year, which can cause significant discomfort. Continue with your current medications as needed.  INSTRUCTIONS: Please follow up with the lung function test and prone imaging CT scan as ordered. Additionally, we will check some basic blood tests for autoimmune diseases. Continue taking your GERD medications as prescribed.                      Contains text generated by Abridge.                                 Contains text generated by Abridge.

## 2023-11-22 DIAGNOSIS — L4050 Arthropathic psoriasis, unspecified: Secondary | ICD-10-CM | POA: Diagnosis not present

## 2023-11-22 LAB — ANTI-NUCLEAR AB-TITER (ANA TITER): ANA Titer 1: 1:40 {titer} — ABNORMAL HIGH

## 2023-11-22 LAB — ANA,IFA RA DIAG PNL W/RFLX TIT/PATN
Anti Nuclear Antibody (ANA): POSITIVE — AB
Cyclic Citrullin Peptide Ab: <16 U
Rheumatoid fact SerPl-aCnc: <10 [IU]/mL (ref <14)

## 2023-11-22 LAB — IGE: IgE (Immunoglobulin E), Serum: 165 kU/L — ABNORMAL HIGH (ref <=114)

## 2023-11-25 ENCOUNTER — Other Ambulatory Visit

## 2023-11-25 ENCOUNTER — Ambulatory Visit
Admission: RE | Admit: 2023-11-25 | Discharge: 2023-11-25 | Disposition: A | Discharge status: Home / Self Care | Source: Ambulatory Visit | Attending: Pulmonary Disease | Admitting: Pulmonary Disease

## 2023-11-25 DIAGNOSIS — R0600 Dyspnea, unspecified: Secondary | ICD-10-CM

## 2023-11-25 DIAGNOSIS — J9811 Atelectasis: Secondary | ICD-10-CM | POA: Diagnosis not present

## 2023-11-25 DIAGNOSIS — J849 Interstitial pulmonary disease, unspecified: Secondary | ICD-10-CM

## 2023-11-30 ENCOUNTER — Ambulatory Visit: Admitting: Pulmonary Disease

## 2023-12-04 ENCOUNTER — Ambulatory Visit: Payer: Self-pay | Admitting: Pulmonary Disease

## 2023-12-04 DIAGNOSIS — J849 Interstitial pulmonary disease, unspecified: Secondary | ICD-10-CM

## 2023-12-08 DIAGNOSIS — L0232 Furuncle of buttock: Secondary | ICD-10-CM | POA: Diagnosis not present

## 2023-12-08 DIAGNOSIS — R030 Elevated blood-pressure reading, without diagnosis of hypertension: Secondary | ICD-10-CM | POA: Diagnosis not present

## 2023-12-08 DIAGNOSIS — D72825 Bandemia: Secondary | ICD-10-CM | POA: Diagnosis not present

## 2023-12-09 DIAGNOSIS — L0231 Cutaneous abscess of buttock: Secondary | ICD-10-CM | POA: Diagnosis not present

## 2023-12-21 DIAGNOSIS — L4050 Arthropathic psoriasis, unspecified: Secondary | ICD-10-CM | POA: Diagnosis not present

## 2023-12-23 DIAGNOSIS — L0231 Cutaneous abscess of buttock: Secondary | ICD-10-CM | POA: Diagnosis not present

## 2023-12-30 DIAGNOSIS — Z79899 Other long term (current) drug therapy: Secondary | ICD-10-CM | POA: Diagnosis not present

## 2024-01-17 DIAGNOSIS — L4050 Arthropathic psoriasis, unspecified: Secondary | ICD-10-CM | POA: Diagnosis not present

## 2024-01-24 DIAGNOSIS — L4050 Arthropathic psoriasis, unspecified: Secondary | ICD-10-CM | POA: Diagnosis not present

## 2024-01-24 DIAGNOSIS — L400 Psoriasis vulgaris: Secondary | ICD-10-CM | POA: Diagnosis not present

## 2024-01-24 DIAGNOSIS — Z79899 Other long term (current) drug therapy: Secondary | ICD-10-CM | POA: Diagnosis not present

## 2024-01-24 DIAGNOSIS — R7689 Other specified abnormal immunological findings in serum: Secondary | ICD-10-CM | POA: Diagnosis not present

## 2024-01-25 DIAGNOSIS — R197 Diarrhea, unspecified: Secondary | ICD-10-CM | POA: Diagnosis not present

## 2024-01-25 DIAGNOSIS — K219 Gastro-esophageal reflux disease without esophagitis: Secondary | ICD-10-CM | POA: Diagnosis not present

## 2024-01-25 DIAGNOSIS — R1310 Dysphagia, unspecified: Secondary | ICD-10-CM | POA: Diagnosis not present

## 2024-01-25 DIAGNOSIS — R1084 Generalized abdominal pain: Secondary | ICD-10-CM | POA: Diagnosis not present

## 2024-01-25 DIAGNOSIS — R1115 Cyclical vomiting syndrome unrelated to migraine: Secondary | ICD-10-CM | POA: Diagnosis not present

## 2024-01-28 DIAGNOSIS — R197 Diarrhea, unspecified: Secondary | ICD-10-CM | POA: Diagnosis not present

## 2024-02-18 ENCOUNTER — Encounter

## 2024-02-18 ENCOUNTER — Ambulatory Visit: Admitting: Adult Health

## 2024-02-21 ENCOUNTER — Ambulatory Visit (INDEPENDENT_AMBULATORY_CARE_PROVIDER_SITE_OTHER)

## 2024-02-21 ENCOUNTER — Ambulatory Visit (INDEPENDENT_AMBULATORY_CARE_PROVIDER_SITE_OTHER): Admitting: Adult Health

## 2024-02-21 ENCOUNTER — Encounter: Payer: Self-pay | Admitting: Adult Health

## 2024-02-21 ENCOUNTER — Other Ambulatory Visit: Payer: Self-pay | Admitting: Adult Health

## 2024-02-21 VITALS — BP 138/82 | HR 95 | Temp 98.1°F | Ht 71.0 in | Wt 222.4 lb

## 2024-02-21 DIAGNOSIS — K219 Gastro-esophageal reflux disease without esophagitis: Secondary | ICD-10-CM

## 2024-02-21 DIAGNOSIS — J4530 Mild persistent asthma, uncomplicated: Secondary | ICD-10-CM | POA: Diagnosis not present

## 2024-02-21 DIAGNOSIS — R0600 Dyspnea, unspecified: Secondary | ICD-10-CM

## 2024-02-21 DIAGNOSIS — J310 Chronic rhinitis: Secondary | ICD-10-CM | POA: Diagnosis not present

## 2024-02-21 DIAGNOSIS — J849 Interstitial pulmonary disease, unspecified: Secondary | ICD-10-CM | POA: Diagnosis not present

## 2024-02-21 LAB — PULMONARY FUNCTION TEST
DL/VA % pred: 81 %
DL/VA: 3.29 ml/min/mmHg/L
DLCO unc % pred: 73 %
DLCO unc: 19.7 ml/min/mmHg
FEF 25-75 Post: 4.57 L/s
FEF 25-75 Pre: 3.96 L/s
FEF2575-%Change-Post: 15 %
FEF2575-%Pred-Post: 178 %
FEF2575-%Pred-Pre: 154 %
FEV1-%Change-Post: 5 %
FEV1-%Pred-Post: 90 %
FEV1-%Pred-Pre: 85 %
FEV1-Post: 3.07 L
FEV1-Pre: 2.9 L
FEV1FVC-%Change-Post: -10 %
FEV1FVC-%Pred-Pre: 129 %
FEV6-%Change-Post: 17 %
FEV6-%Pred-Post: 81 %
FEV6-%Pred-Pre: 70 %
FEV6-Post: 3.56 L
FEV6-Pre: 3.04 L
FEV6FVC-%Pred-Post: 105 %
FEV6FVC-%Pred-Pre: 105 %
FVC-%Change-Post: 17 %
FVC-%Pred-Post: 78 %
FVC-%Pred-Pre: 66 %
FVC-Post: 3.59 L
FVC-Pre: 3.04 L
Post FEV1/FVC ratio: 86 %
Post FEV6/FVC ratio: 100 %
Pre FEV1/FVC ratio: 95 %
Pre FEV6/FVC Ratio: 100 %
RV % pred: 157 %
RV: 3.95 L
TLC % pred: 103 %
TLC: 7.48 L

## 2024-02-21 MED ORDER — FLUTICASONE FUROATE-VILANTEROL 100-25 MCG/ACT IN AEPB: 1.0000 | INHALATION_SPRAY | Freq: Every day | RESPIRATORY_TRACT | 5 refills | Status: AC

## 2024-02-21 MED ORDER — ALBUTEROL SULFATE HFA 108 (90 BASE) MCG/ACT IN AERS
1.0000 | INHALATION_SPRAY | Freq: Four times a day (QID) | RESPIRATORY_TRACT | 2 refills | Status: AC | PRN
Start: 2024-02-21 — End: ?

## 2024-02-21 NOTE — Progress Notes (Signed)
 @Patient  ID: Scott Dominguez, male    DOB: January 27, 1956, 68 y.o.   MRN: [id number]  Chief Complaint  Patient presents with   Interstitial Lung Disease    PFT F/U    Referring provider: Janey Santos, MD  HPI: 68 year old male never smoker seen for pulmonary consult November 18, 2023 for asthma and shortness of breath, interstitial lung disease Medical history significant for psoriatic arthritis followed by rheumatology    TEST/EVENTS : Reviewed 02/21/2024  High-resolution CT chest 26 2024 shows possible mild subpleural reticulation in the lung bases right greater than left.  Findings are indeterminate for UIP.  Minimal air trapping  High-resolution CT chest November 25, 2023 no significant change in bandlike scarring in bilateral lung bases findings are indeterminate for UIP.  Discussed the use of AI scribe software for clinical note transcription with the patient, who gave verbal consent to proceed.  History of Present Illness Scott Dominguez is a 68 year old male with a history of asthma and mild interstitial lung disease. Says his breathing is doing about the same.  He experiences breathing difficulties, particularly when exposed to extreme temperatures. He describes a sensation of 'somebody's sitting on my chest' when it is hot outside and experiences wheezing upon overexertion. Cold weather also causes his lungs to hurt. He has a history of asthma in his forties, which resolved after using Advair for two to three months. He does not currently use an albuterol inhaler.  He was set up for pulmonary function testing that was completed today that shows some mild restriction.  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  He has a history of psoriatic arthritis and is transitioning to Stelara due to logistical challenges with travel for treatment. He has  not yet started Stelara but plans to take the first dose soon.  He has a history of GERD and is currently taking Voquenza, which he reports is working well. He also uses Flonase several times a week to manage dryness, particularly in the mornings due to the warm room temperature needed for his arthritis.  He experiences limitations in physical activity due to breathing difficulties, needing to stop and rest when exertion makes it hard to breathe. He tries to stay active, walking with his wife two to three times a week, but finds it challenging due to frequent medical appointments.     Allergies  Allergen Reactions   Ivp Dye [Iodinated Contrast Media] Hives     There is no immunization history on file for this patient.  Past Medical History:  Diagnosis Date   Anxiety    Asthma    no porblem in a year   Chronic abdominal pain    Chronic foot pain    bilateral   Depression    GERD (gastroesophageal reflux disease)    Headache    Kidney stones    multiple over the past 30 years   Psoriasis    Vertigo     Tobacco History: Social History   Tobacco Use  Smoking Status Never  Smokeless Tobacco Never   Counseling given: Not Answered   Outpatient Medications Prior to Visit  Medication Sig Dispense Refill   amitriptyline (ELAVIL) 25 MG tablet Take 25 mg by mouth at bedtime.     Cholecalciferol (VITAMIN D PO) Take 5,000 Units by mouth every morning.  ibuprofen (ADVIL,MOTRIN) 200 MG tablet Take 600 mg by mouth every 6 (six) hours as needed for moderate pain.     Multiple Vitamin (MULTIVITAMIN WITH MINERALS) TABS tablet Take 1 tablet by mouth every morning.     oxyCODONE  (ROXICODONE ) 5 MG immediate release tablet Take 1 tablet (5 mg total) by mouth every 4 (four) hours as needed. 15 tablet 0   oxyCODONE -acetaminophen  (ROXICET) 5-325 MG per tablet Take 1 tablet by mouth every 4 (four) hours as needed for severe pain. 30 tablet 0   promethazine  (PHENERGAN ) 25 MG suppository  Place 25 mg rectally every 8 (eight) hours as needed.     rosuvastatin (CRESTOR) 5 MG tablet Take 5 mg by mouth daily.     STELARA 90 MG/ML SOSY injection Inject into the skin.     Vonoprazan Fumarate 10 MG TABS Take 10 mg by mouth.     ALPRAZolam  (XANAX ) 1 MG tablet Take 1 tablet (1 mg total) by mouth at bedtime as needed for anxiety (for use during sleep test). (Patient not taking: Reported on 02/21/2024) 2 tablet 0   CERTOLIZUMAB PEGOL  [place] Inject 400 mg into the skin. (Patient not taking: Reported on 02/21/2024)     cholestyramine (QUESTRAN) 4 G packet MIX 1 PACKET WITH WATER OR NON-CARBONATED DRINK TWICE A DAY FOR 30 DAYS (Patient not taking: Reported on 02/21/2024)  11   DEXILANT 60 MG capsule Take 1 capsule by mouth every morning. (Patient not taking: Reported on 02/21/2024)  10   ENBREL SURECLICK 50 MG/ML injection Inject 50 mg as directed every 7 (seven) days. (Patient not taking: Reported on 02/21/2024)  4   Fluticasone-Salmeterol (ADVAIR) 250-50 MCG/DOSE AEPB Inhale 1 puff into the lungs 2 (two) times daily as needed (for asthma attacks).  (Patient not taking: Reported on 02/21/2024)     phenazopyridine  (PYRIDIUM ) 200 MG tablet Take 1 tablet (200 mg total) by mouth 3 (three) times daily as needed for pain (for urinary burning and spasm). (Patient not taking: Reported on 02/21/2024) 30 tablet 3   tamsulosin  (FLOMAX ) 0.4 MG CAPS capsule Take 1 capsule (0.4 mg total) by mouth daily. (Patient not taking: Reported on 02/21/2024) 21 capsule 0   No facility-administered medications prior to visit.     Review of Systems:   Constitutional:   No  weight loss, night sweats,  Fevers, chills, +fatigue, or  lassitude.  HEENT:   No headaches,  Difficulty swallowing,  Tooth/dental problems, or  Sore throat,                No sneezing, itching, ear ache, nasal congestion, post nasal drip,   CV:  No chest pain,  Orthopnea, PND, swelling in lower extremities, anasarca, dizziness, palpitations, syncope.   GI   No heartburn, indigestion, abdominal pain, nausea, vomiting, diarrhea, change in bowel habits, loss of appetite, bloody stools.   Resp:    No chest wall deformity  Skin: no rash or lesions.  GU: no dysuria, change in color of urine, no urgency or frequency.  No flank pain, no hematuria   MS:  No joint pain or swelling.  No decreased range of motion.  No back pain.    Physical Exam  BP 138/82   Pulse 95   Temp 98.1 F (36.7 C) (Oral)   Ht 5' 11 (1.803 m) Comment: per patient  Wt 222 lb 6.4 oz (100.9 kg)   SpO2 97% Comment: on RA  BMI 31.02 kg/m   GEN: A/Ox3; pleasant , NAD, well nourished  HEENT:  [place]/AT,   NOSE-clear, THROAT-clear, no lesions, no postnasal drip or exudate noted.   NECK:  Supple w/ fair ROM; no JVD; normal carotid impulses w/o bruits; no thyromegaly or nodules palpated; no lymphadenopathy.    RESP  Clear  P & A; w/o, wheezes/ rales/ or rhonchi. no accessory muscle use, no dullness to percussion  CARD:  RRR, no m/r/g, no peripheral edema, pulses intact, no cyanosis or clubbing.  GI:   Soft & nt; nml bowel sounds; no organomegaly or masses detected.   Musco: Warm bil, no deformities or joint swelling noted.   Neuro: alert, no focal deficits noted.    Skin: Warm, no lesions or rashes    Lab Results:Reviewed 02/21/2024   CBC    Component Value Date/Time   WBC 8.8 11/18/2023 0918   RBC 4.54 11/18/2023 0918   HGB 14.1 11/18/2023 0918   HCT 41.6 11/18/2023 0918   PLT 264.0 11/18/2023 0918   MCV 91.8 11/18/2023 0918   MCH 32.1 07/11/2014 1100   MCHC 33.8 11/18/2023 0918   RDW 13.3 11/18/2023 0918   LYMPHSABS 2.7 11/18/2023 0918   MONOABS 0.8 11/18/2023 0918   EOSABS 0.5 11/18/2023 0918   BASOSABS 0.1 11/18/2023 0918    BMET    Component Value Date/Time   NA 137 06/09/2014 1009   K 3.7 06/09/2014 1009   CL 103 06/09/2014 1009   CO2 27 06/09/2014 1009   GLUCOSE 109 (H) 06/09/2014 1009   BUN 12 06/09/2014 1009   CREATININE 1.22 06/09/2014  1009   CALCIUM 9.4 06/09/2014 1009   GFRNONAA 63 (L) 06/09/2014 1009   GFRAA 73 (L) 06/09/2014 1009    BNP No results found for: BNP  ProBNP No results found for: PROBNP  Imaging: No results found.  Administration History     None          Latest Ref Rng & Units 02/21/2024   10:33 AM  PFT Results  FVC-Pre L 3.04  P  FVC-Predicted Pre % 66  P  FVC-Post L 3.59  P  FVC-Predicted Post % 78  P  Pre FEV1/FVC % % 95  P  Post FEV1/FCV % % 86  P  FEV1-Pre L 2.90  P  FEV1-Predicted Pre % 85  P  FEV1-Post L 3.07  P  DLCO uncorrected ml/min/mmHg 19.70  P  DLCO UNC% % 73  P  DLVA Predicted % 81  P  TLC L 7.48  P  TLC % Predicted % 103  P  RV % Predicted % 157  P    P Preliminary result    No results found for: NITRICOXIDE      No data to display              Assessment & Plan:   Assessment and Plan Assessment & Plan Mild persistent asthma asthma -symptomatic He experiences intermittent wheezing and dyspnea, worsened by extreme temperatures and exertion. Asthma was diagnosed in his forties and previously managed with Advair. Pulmonary function tests show mild restriction,  GERD and chronic sinus issues may also contribute. Start Breo inhaler once daily and prescribe albuterol inhaler for PRN use. Schedule a follow-up pulmonary function test in six months  Encourage regular physical activity to improve endurance and reduce deconditioning. Advise rinsing mouth after inhaler use to prevent oral side effects.  Gastroesophageal reflux disease (GERD) controlled Chronic GERD is effectively managed with Voquenza, a potassium competitive acid blocker. GERD may contribute to respiratory symptoms. Continue Voquenza and  maintain a GERD diet.   Chronic rhinitis -mild symptomatic  consider using saline nasal spray for sinus dryness.  Flonase as needed     Plan  . Patient Instructions  Begin Breo 1 puff daily, rinse after use  Albuterol inhaler As needed   Saline  nasal rinses As needed   Flonase nasal As needed   GERD diet  Continue Voquezna 10mg  daily  Follow up with Dr. Theophilus or Anandi Abramo NP in 3 months and As needed         Madelin Stank, NP 02/21/2024  I spent 34  minutes dedicated to the care of this patient on the date of this encounter to include pre-visit review of records, face-to-face time with the patient discussing conditions above, post visit ordering of testing, clinical documentation with the electronic health record, making appropriate referrals as documented, and communicating necessary findings to members of the patients care team.

## 2024-02-21 NOTE — Patient Instructions (Signed)
 Full PFT performed today.

## 2024-02-21 NOTE — Patient Instructions (Addendum)
 Begin Breo 1 puff daily, rinse after use  Albuterol inhaler As needed   Saline nasal rinses As needed   Flonase nasal As needed   GERD diet  Continue Voquezna 10mg  daily  Follow up with Dr. Theophilus or Sequoia Witz NP in 3 months and As needed

## 2024-02-21 NOTE — Progress Notes (Signed)
 Full PFT performed today.

## 2024-03-06 DIAGNOSIS — H269 Unspecified cataract: Secondary | ICD-10-CM | POA: Diagnosis not present

## 2024-03-06 DIAGNOSIS — N1831 Chronic kidney disease, stage 3a: Secondary | ICD-10-CM | POA: Diagnosis not present

## 2024-03-06 DIAGNOSIS — E781 Pure hyperglyceridemia: Secondary | ICD-10-CM | POA: Diagnosis not present

## 2024-03-06 DIAGNOSIS — N200 Calculus of kidney: Secondary | ICD-10-CM | POA: Diagnosis not present

## 2024-03-06 DIAGNOSIS — R030 Elevated blood-pressure reading, without diagnosis of hypertension: Secondary | ICD-10-CM | POA: Diagnosis not present

## 2024-05-18 ENCOUNTER — Encounter: Payer: Self-pay | Admitting: Nurse Practitioner

## 2024-05-19 ENCOUNTER — Other Ambulatory Visit: Payer: Self-pay | Admitting: Nurse Practitioner

## 2024-05-19 ENCOUNTER — Other Ambulatory Visit

## 2024-05-19 DIAGNOSIS — J329 Chronic sinusitis, unspecified: Secondary | ICD-10-CM

## 2024-05-22 ENCOUNTER — Telehealth: Admitting: Adult Health

## 2024-05-22 ENCOUNTER — Encounter: Payer: Self-pay | Admitting: Adult Health

## 2024-05-22 DIAGNOSIS — R918 Other nonspecific abnormal finding of lung field: Secondary | ICD-10-CM | POA: Diagnosis not present

## 2024-05-22 DIAGNOSIS — J849 Interstitial pulmonary disease, unspecified: Secondary | ICD-10-CM | POA: Diagnosis not present

## 2024-05-22 DIAGNOSIS — J4530 Mild persistent asthma, uncomplicated: Secondary | ICD-10-CM

## 2024-05-22 DIAGNOSIS — J4520 Mild intermittent asthma, uncomplicated: Secondary | ICD-10-CM | POA: Diagnosis not present

## 2024-05-22 NOTE — Progress Notes (Signed)
 Virtual Visit via Video Note  I connected with Scott Dominguez on 05/22/24 at  9:00 AM EST by a video enabled telemedicine application and verified that I am speaking with the correct person using two identifiers.  Location: Patient: Home  Provider: Home Office    I discussed the limitations of evaluation and management by telemedicine and the availability of in person appointments. The patient expressed understanding and agreed to proceed.  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  Last visit patient was having ongoing difficulties with shortness of breath with activity and intermittent wheezing especially in high temperatures.  Pulmonary function testing last visit showed some mild restriction and decreased diffusing capacity.  Patient was started on Breo for asthma.  High-resolution CT chest August 2025 showed stable bandlike scarring in the bases, indeterminate for UIP.  Favored for postinfectious/inflammatory scarring.  Since last visit patient says he is doing much better.  His breathing has substantially improved.  Patient says that during this last few months he was diagnosed with diabetes.  Has made a total change of his dietary intake.  He is no longer eating sugars and is feeling so much better.  His weight is down feels that his breathing is much better.  He is able to go out and do more activities.  He has improved activity tolerance.  Also says that his memory has improved and his arthritis and joint pain is also substantially improved.  He has also cut his A1c down to 6.4 from 8.2.  Patient says he only took Breo briefly did not feel like it made any  change in his breathing.  Has had no albuterol  use.    Observations/Objective: 05/22/2024 Appears in NAD   High-resolution CT chest 26 2024 shows possible mild subpleural reticulation in the lung bases right greater than left.  Findings are indeterminate for UIP.  Minimal air trapping   High-resolution CT chest November 25, 2023 no significant change in bandlike scarring in bilateral lung bases findings are indeterminate for UIP.  02/2024 pulmonary function testing that was completed today that shows some mild restriction. With FEV1 at 95, FVC 66%, postbronchodilator FEV1 90%, ratio 86, FVC 78% DLCO 73%     Assessment and Plan: Mild intermittent asthma.  Currently under good control.  May use albuterol  as needed.  No perceived benefit on Breo.  Has minimal symptoms.  Previous PFT showed mild to moderate restriction. Abnormal CT chest with mild ILD changes-favored to be postinflammatory/infectious scarring.  High-resolution CT chest August 2025 showed stable changes.-Pulmonary function testing showed mild restriction and slightly decreased diffusing capacity.-Will repeat PFTs in 6 months.  Plan  Patient Instructions  Begin Breo 1 puff daily, rinse after use  Albuterol  inhaler As needed   Saline nasal rinses As needed   Flonase nasal As needed   GERD diet  Continue Voquezna 10mg  daily  Follow up with Dr. Theophilus or Ladeja Pelham NP in 3 months and As needed       Follow Up Instructions:    I discussed the assessment and treatment plan with the patient. The patient was provided an opportunity to ask questions and all were answered. The patient agreed with the plan and demonstrated an understanding of the instructions.  The patient was advised to call back or seek an in-person evaluation if the symptoms worsen or if the condition fails to improve as anticipated.  I provided 24 minutes of non-face-to-face time during this encounter.   Madelin Stank, NP

## 2024-05-22 NOTE — Patient Instructions (Addendum)
 Albuterol  inhaler As needed   Saline nasal rinses As needed   Flonase nasal As needed   GERD diet  Follow up with Dr. Theophilus or Jayni Prescher NP in 6 months and As needed  with PFT

## 2024-05-23 ENCOUNTER — Ambulatory Visit
Admission: RE | Admit: 2024-05-23 | Discharge: 2024-05-23 | Disposition: A | Source: Ambulatory Visit | Attending: Nurse Practitioner

## 2024-05-23 ENCOUNTER — Ambulatory Visit: Admitting: Adult Health

## 2024-05-23 ENCOUNTER — Other Ambulatory Visit

## 2024-05-23 DIAGNOSIS — J329 Chronic sinusitis, unspecified: Secondary | ICD-10-CM

## 2024-05-29 ENCOUNTER — Other Ambulatory Visit
